# Patient Record
Sex: Female | Born: 1955 | Race: White | Hispanic: No | Smoking: Current every day smoker
Health system: Southern US, Community
[De-identification: ages and names within clinical notes are randomized; demographics above are authoritative.]

## PROBLEM LIST (undated history)

## (undated) DIAGNOSIS — J45909 Unspecified asthma, uncomplicated: Secondary | ICD-10-CM

## (undated) DIAGNOSIS — T7840XA Allergy, unspecified, initial encounter: Secondary | ICD-10-CM

## (undated) DIAGNOSIS — Z9889 Other specified postprocedural states: Secondary | ICD-10-CM

## (undated) DIAGNOSIS — M1990 Unspecified osteoarthritis, unspecified site: Secondary | ICD-10-CM

## (undated) DIAGNOSIS — J432 Centrilobular emphysema: Secondary | ICD-10-CM

## (undated) DIAGNOSIS — K219 Gastro-esophageal reflux disease without esophagitis: Secondary | ICD-10-CM

## (undated) DIAGNOSIS — R112 Nausea with vomiting, unspecified: Secondary | ICD-10-CM

## (undated) HISTORY — DX: Allergy, unspecified, initial encounter: T78.40XA

## (undated) HISTORY — PX: CLEFT PALATE REPAIR: SUR1165

## (undated) HISTORY — PX: BREAST BIOPSY: SHX20

---

## 1982-06-12 HISTORY — PX: ANKLE SURGERY: SHX546

## 2008-05-21 ENCOUNTER — Ambulatory Visit: Payer: Self-pay | Admitting: Gastroenterology

## 2008-06-12 HISTORY — PX: COLONOSCOPY: SHX174

## 2008-06-12 HISTORY — PX: CHOLECYSTECTOMY: SHX55

## 2008-07-07 ENCOUNTER — Ambulatory Visit: Payer: Self-pay

## 2008-10-05 ENCOUNTER — Ambulatory Visit: Payer: Self-pay | Admitting: Internal Medicine

## 2009-03-09 ENCOUNTER — Emergency Department: Payer: Self-pay | Admitting: Emergency Medicine

## 2009-03-10 ENCOUNTER — Ambulatory Visit: Payer: Self-pay | Admitting: Surgery

## 2009-07-08 ENCOUNTER — Ambulatory Visit: Payer: Self-pay

## 2010-07-20 ENCOUNTER — Ambulatory Visit: Payer: Self-pay | Admitting: Family Medicine

## 2010-07-25 ENCOUNTER — Ambulatory Visit: Payer: Self-pay

## 2011-01-25 ENCOUNTER — Ambulatory Visit: Payer: Self-pay | Admitting: Surgery

## 2011-07-26 ENCOUNTER — Ambulatory Visit: Payer: Self-pay | Admitting: Family Medicine

## 2012-02-21 ENCOUNTER — Ambulatory Visit: Payer: Self-pay | Admitting: Family Medicine

## 2012-07-30 ENCOUNTER — Ambulatory Visit: Payer: Self-pay | Admitting: Family Medicine

## 2012-07-31 ENCOUNTER — Ambulatory Visit: Payer: Self-pay | Admitting: Family Medicine

## 2012-12-02 ENCOUNTER — Ambulatory Visit: Payer: Self-pay | Admitting: Surgery

## 2012-12-17 ENCOUNTER — Ambulatory Visit: Payer: Self-pay | Admitting: Surgery

## 2012-12-18 LAB — PATHOLOGY REPORT

## 2013-08-05 ENCOUNTER — Ambulatory Visit: Payer: Self-pay | Admitting: Surgery

## 2013-08-11 ENCOUNTER — Ambulatory Visit: Payer: Self-pay | Admitting: Surgery

## 2013-08-12 LAB — PATHOLOGY REPORT

## 2015-03-22 ENCOUNTER — Encounter: Payer: Self-pay | Admitting: Internal Medicine

## 2015-03-22 ENCOUNTER — Other Ambulatory Visit: Payer: Self-pay | Admitting: Internal Medicine

## 2015-03-22 DIAGNOSIS — Z8711 Personal history of peptic ulcer disease: Secondary | ICD-10-CM

## 2015-03-22 DIAGNOSIS — Z8619 Personal history of other infectious and parasitic diseases: Secondary | ICD-10-CM

## 2015-03-22 DIAGNOSIS — J302 Other seasonal allergic rhinitis: Secondary | ICD-10-CM | POA: Insufficient documentation

## 2015-03-22 DIAGNOSIS — Z87898 Personal history of other specified conditions: Secondary | ICD-10-CM | POA: Insufficient documentation

## 2015-03-22 DIAGNOSIS — J3089 Other allergic rhinitis: Secondary | ICD-10-CM | POA: Insufficient documentation

## 2015-03-22 DIAGNOSIS — R7309 Other abnormal glucose: Secondary | ICD-10-CM

## 2015-03-22 DIAGNOSIS — N6019 Diffuse cystic mastopathy of unspecified breast: Secondary | ICD-10-CM

## 2015-03-22 DIAGNOSIS — R7303 Prediabetes: Secondary | ICD-10-CM | POA: Insufficient documentation

## 2015-03-22 DIAGNOSIS — Z720 Tobacco use: Secondary | ICD-10-CM

## 2015-03-22 HISTORY — DX: Personal history of other infectious and parasitic diseases: Z86.19

## 2015-03-22 HISTORY — DX: Personal history of other specified conditions: Z87.898

## 2015-04-08 ENCOUNTER — Ambulatory Visit (INDEPENDENT_AMBULATORY_CARE_PROVIDER_SITE_OTHER): Payer: PRIVATE HEALTH INSURANCE | Admitting: Internal Medicine

## 2015-04-08 ENCOUNTER — Encounter: Payer: Self-pay | Admitting: Internal Medicine

## 2015-04-08 VITALS — BP 112/72 | HR 68 | Ht 65.0 in | Wt 172.6 lb

## 2015-04-08 DIAGNOSIS — J302 Other seasonal allergic rhinitis: Secondary | ICD-10-CM

## 2015-04-08 DIAGNOSIS — Z0000 Encounter for general adult medical examination without abnormal findings: Secondary | ICD-10-CM | POA: Diagnosis not present

## 2015-04-08 DIAGNOSIS — G5601 Carpal tunnel syndrome, right upper limb: Secondary | ICD-10-CM | POA: Diagnosis not present

## 2015-04-08 DIAGNOSIS — N6019 Diffuse cystic mastopathy of unspecified breast: Secondary | ICD-10-CM | POA: Diagnosis not present

## 2015-04-08 LAB — POCT URINALYSIS DIPSTICK
Bilirubin, UA: NEGATIVE
Blood, UA: NEGATIVE
Glucose, UA: NEGATIVE
Ketones, UA: NEGATIVE
Leukocytes, UA: NEGATIVE
Nitrite, UA: NEGATIVE
PROTEIN UA: NEGATIVE
UROBILINOGEN UA: 0.2
pH, UA: 5

## 2015-04-08 NOTE — Progress Notes (Signed)
Date:  04/08/2015   Name:  Diamond Jackson   DOB:  June 10, 1956   MRN:  [id number]   Chief Complaint: Annual Exam Diamond Jackson is a 59 y.o. female who presents today for her Complete Annual Exam. She feels well. She reports exercising regularly to lower A1C. She reports she is sleeping well. She denies any breast problems.  Last pap smear 2 years ago - normal. She is due for her screening mammogram. She brings blood work done through work which is a complete panel and is normal. A1c is 5.6. Lipid profile is excellent. She has noted occasional tingling in her right hand as well as drawing at the wrist if she holds her wrist in a certain position for a period of time. This happens most commonly while driving the car. Episodes approximately 2 times per month.  Review of Systems  Constitutional: Negative for fever, chills and fatigue.  HENT: Negative for hearing loss, tinnitus and trouble swallowing.   Eyes: Negative for visual disturbance.  Respiratory: Negative for cough, shortness of breath and wheezing.   Cardiovascular: Negative for chest pain, palpitations and leg swelling.  Gastrointestinal: Negative for nausea, abdominal pain and constipation.  Endocrine: Negative for polydipsia and polyuria.  Genitourinary: Negative for dysuria, urgency, frequency, vaginal bleeding and vaginal discharge.  Musculoskeletal: Positive for arthralgias (hips and knees). Negative for joint swelling and gait problem.  Skin: Negative for color change and rash.  Neurological: Negative for light-headedness, numbness and headaches.  Psychiatric/Behavioral: Negative for sleep disturbance and dysphoric mood.    Patient Active Problem List   Diagnosis Date Noted  . Allergic rhinitis, seasonal 03/22/2015  . History of measles, mumps, or rubella 03/22/2015  . Elevated hemoglobin A1c 03/22/2015  . Fibrocystic breast changes 03/22/2015  . Hx of peptic ulcer 03/22/2015  . Current tobacco use 03/22/2015    Prior to  Admission medications   Medication Sig Start Date End Date Taking? Authorizing Provider  albuterol (PROAIR HFA) 108 (90 BASE) MCG/ACT inhaler Inhale into the lungs. 09/04/13   Historical Provider, MD    No Known Allergies  Past Surgical History  Procedure Laterality Date  . Breast biopsy      benign  . Cholecystectomy  2010  . Ankle surgery Right 1984  . Cleft palate repair      as an infant    Social History  Substance Use Topics  . Smoking status: Current Every Day Smoker  . Smokeless tobacco: None  . Alcohol Use: No     Medication list has been reviewed and updated.   Physical Exam  Constitutional: She is oriented to person, place, and time. She appears well-developed and well-nourished. No distress.  HENT:  Head: Normocephalic and atraumatic.  Right Ear: Tympanic membrane and ear canal normal.  Left Ear: Tympanic membrane and ear canal normal.  Nose: Right sinus exhibits no maxillary sinus tenderness. Left sinus exhibits no maxillary sinus tenderness.  Mouth/Throat: Uvula is midline and oropharynx is clear and moist.  Eyes: Conjunctivae and EOM are normal. Right eye exhibits no discharge. Left eye exhibits no discharge. No scleral icterus.  Neck: Normal range of motion. Carotid bruit is not present. No erythema present. No thyromegaly present.  Cardiovascular: Normal rate, regular rhythm, normal heart sounds and normal pulses.   Pulmonary/Chest: Effort normal. No respiratory distress. She has no wheezes. Right breast exhibits inverted nipple. Right breast exhibits no mass, no nipple discharge, no skin change and no tenderness. Left breast exhibits no mass, no nipple discharge,  no skin change and no tenderness.  Abdominal: Soft. Bowel sounds are normal. There is no hepatosplenomegaly. There is no tenderness. There is no CVA tenderness.  Genitourinary: Rectum normal, vagina normal and uterus normal. There is no rash or tenderness on the right labia. There is no rash or  tenderness on the left labia. Cervix exhibits no motion tenderness, no discharge and no friability. Right adnexum displays no mass, no tenderness and no fullness. Left adnexum displays no mass, no tenderness and no fullness.  Musculoskeletal: Normal range of motion.       Arms: Lymphadenopathy:    She has no cervical adenopathy.    She has no axillary adenopathy.  Neurological: She is alert and oriented to person, place, and time. She has normal reflexes. No cranial nerve deficit or sensory deficit.  Skin: Skin is warm, dry and intact. No rash noted.  Psychiatric: She has a normal mood and affect. Her speech is normal and behavior is normal. Thought content normal.  Nursing note and vitals reviewed.   BP 112/72 mmHg  Pulse 68  Ht 5\' 5"  (1.651 m)  Wt 178 lb (80.74 kg)  BMI 29.62 kg/m2  Assessment and Plan: 1. Annual physical exam Pap smear is performed and can be repeated every 3-5 years Colonoscopy was done in 2010 - POCT urinalysis dipstick  2. Allergic rhinitis, seasonal Zyrtec as needed Albuterol inhaler as needed  3. Fibrocystic breast changes, unspecified laterality Normal breast exam except for right inverted nipple Patient will continue self exams - MM DIGITAL SCREENING BILATERAL; Future  4. Carpal tunnel syndrome on right Avoid positions that cause symptoms If symptoms progress would refer to orthopedic hand specialist   Halina Maidens, MD [facility]  04/08/2015

## 2015-04-08 NOTE — Addendum Note (Signed)
Addended by: Glean Hess on: 04/08/2015 11:11 AM   Modules accepted: Orders, SmartSet

## 2015-04-08 NOTE — Patient Instructions (Signed)
Breast Self-Awareness Practicing breast self-awareness may pick up problems early, prevent significant medical complications, and possibly save your life. By practicing breast self-awareness, you can become familiar with how your breasts look and feel and if your breasts are changing. This allows you to notice changes early. It can also offer you some reassurance that your breast health is good. One way to learn what is normal for your breasts and whether your breasts are changing is to do a breast self-exam. If you find a lump or something that was not present in the past, it is best to contact your caregiver right away. Other findings that should be evaluated by your caregiver include nipple discharge, especially if it is bloody; skin changes or reddening; areas where the skin seems to be pulled in (retracted); or new lumps and bumps. Breast pain is seldom associated with cancer (malignancy), but should also be evaluated by a caregiver. HOW TO PERFORM A BREAST SELF-EXAM The best time to examine your breasts is 5-7 days after your menstrual period is over. During menstruation, the breasts are lumpier, and it may be more difficult to pick up changes. If you do not menstruate, have reached menopause, or had your uterus removed (hysterectomy), you should examine your breasts at regular intervals, such as monthly. If you are breastfeeding, examine your breasts after a feeding or after using a breast pump. Breast implants do not decrease the risk for lumps or tumors, so continue to perform breast self-exams as recommended. Talk to your caregiver about how to determine the difference between the implant and breast tissue. Also, talk about the amount of pressure you should use during the exam. Over time, you will become more familiar with the variations of your breasts and more comfortable with the exam. A breast self-exam requires you to remove all your clothes above the waist. 1. Look at your breasts and nipples.  Stand in front of a mirror in a room with good lighting. With your hands on your hips, push your hands firmly downward. Look for a difference in shape, contour, and size from one breast to the other (asymmetry). Asymmetry includes puckers, dips, or bumps. Also, look for skin changes, such as reddened or scaly areas on the breasts. Look for nipple changes, such as discharge, dimpling, repositioning, or redness. 2. Carefully feel your breasts. This is best done either in the shower or tub while using soapy water or when flat on your back. Place the arm (on the side of the breast you are examining) above your head. Use the pads (not the fingertips) of your three middle fingers on your opposite hand to feel your breasts. Start in the underarm area and use  inch (2 cm) overlapping circles to feel your breast. Use 3 different levels of pressure (light, medium, and firm pressure) at each circle before moving to the next circle. The light pressure is needed to feel the tissue closest to the skin. The medium pressure will help to feel breast tissue a little deeper, while the firm pressure is needed to feel the tissue close to the ribs. Continue the overlapping circles, moving downward over the breast until you feel your ribs below your breast. Then, move one finger-width towards the center of the body. Continue to use the  inch (2 cm) overlapping circles to feel your breast as you move slowly up toward the collar bone (clavicle) near the base of the neck. Continue the up and down exam using all 3 pressures until you reach the   middle of the chest. Do this with each breast, carefully feeling for lumps or changes. 3.  Keep a written record with breast changes or normal findings for each breast. By writing this information down, you do not need to depend only on memory for size, tenderness, or location. Write down where you are in your menstrual cycle, if you are still menstruating. Breast tissue can have some lumps or  thick tissue. However, see your caregiver if you find anything that concerns you.  SEEK MEDICAL CARE IF:  You see a change in shape, contour, or size of your breasts or nipples.   You see skin changes, such as reddened or scaly areas on the breasts or nipples.   You have an unusual discharge from your nipples.   You feel a new lump or unusually thick areas.    This information is not intended to replace advice given to you by your health care provider. Make sure you discuss any questions you have with your health care provider.   Document Released: 05/29/2005 Document Revised: 05/15/2012 Document Reviewed: 09/13/2011 Elsevier Interactive Patient Education 2016 [employer]  

## 2015-04-13 LAB — PAP IG AND HPV HIGH-RISK
HPV, high-risk: NEGATIVE
PAP Smear Comment: 0

## 2015-04-30 ENCOUNTER — Ambulatory Visit
Admission: RE | Admit: 2015-04-30 | Discharge: 2015-04-30 | Disposition: A | Discharge status: Home / Self Care | Payer: PRIVATE HEALTH INSURANCE | Source: Ambulatory Visit | Attending: Internal Medicine | Admitting: Internal Medicine

## 2015-04-30 DIAGNOSIS — Z1231 Encounter for screening mammogram for malignant neoplasm of breast: Secondary | ICD-10-CM | POA: Diagnosis not present

## 2015-04-30 DIAGNOSIS — N6019 Diffuse cystic mastopathy of unspecified breast: Secondary | ICD-10-CM

## 2015-06-08 ENCOUNTER — Ambulatory Visit (INDEPENDENT_AMBULATORY_CARE_PROVIDER_SITE_OTHER): Payer: PRIVATE HEALTH INSURANCE | Admitting: Internal Medicine

## 2015-06-08 ENCOUNTER — Encounter: Payer: Self-pay | Admitting: Internal Medicine

## 2015-06-08 VITALS — BP 118/70 | HR 88 | Ht 65.0 in | Wt 176.8 lb

## 2015-06-08 DIAGNOSIS — R300 Dysuria: Secondary | ICD-10-CM | POA: Diagnosis not present

## 2015-06-08 LAB — POCT URINALYSIS DIPSTICK
Bilirubin, UA: NEGATIVE
GLUCOSE UA: NEGATIVE
NITRITE UA: NEGATIVE
PH UA: 5
PROTEIN UA: NEGATIVE
SPEC GRAV UA: 1.02
Urobilinogen, UA: 0.2

## 2015-06-08 MED ORDER — CIPROFLOXACIN HCL 250 MG PO TABS: 250.0000 mg | ORAL_TABLET | Freq: Two times a day (BID) | ORAL | Status: DC

## 2015-06-08 NOTE — Progress Notes (Signed)
    Date:  06/08/2015   Name:  Diamond Jackson   DOB:  July 08, 1955   MRN:  [id number]   Chief Complaint: Urinary Tract Infection Urinary Tract Infection  This is a new problem. The current episode started in the past 7 days. The problem occurs every urination. The problem has been unchanged. The quality of the pain is described as burning. The pain is mild. Associated symptoms include frequency and urgency. Pertinent negatives include no chills, hematuria or vomiting. She has tried increased fluids for the symptoms. The treatment provided mild relief. Her past medical history is significant for recurrent UTIs.    Review of Systems  Constitutional: Negative for fever, chills and fatigue.  Respiratory: Negative for chest tightness and shortness of breath.   Cardiovascular: Negative for chest pain.  Gastrointestinal: Negative for vomiting, abdominal pain and diarrhea.  Genitourinary: Positive for dysuria, urgency and frequency. Negative for hematuria.  Musculoskeletal: Negative for back pain.    Patient Active Problem List   Diagnosis Date Noted  . Allergic rhinitis, seasonal 03/22/2015  . History of measles, mumps, or rubella 03/22/2015  . Elevated hemoglobin A1c 03/22/2015  . Fibrocystic breast changes 03/22/2015  . Hx of peptic ulcer 03/22/2015  . Current tobacco use 03/22/2015    Prior to Admission medications   Medication Sig Start Date End Date Taking? Authorizing Provider  albuterol (PROAIR HFA) 108 (90 BASE) MCG/ACT inhaler Inhale into the lungs. 09/04/13  Yes Historical Provider, MD  cetirizine (ZYRTEC) 10 MG tablet Take 10 mg by mouth daily as needed for allergies.   Yes Historical Provider, MD    No Known Allergies  Past Surgical History  Procedure Laterality Date  . Cholecystectomy  2010  . Ankle surgery Right 1984  . Cleft palate repair      as an infant  . Colonoscopy  2010    normal  . Breast biopsy Left     benign two areas    Social History  Substance Use  Topics  . Smoking status: Current Every Day Smoker  . Smokeless tobacco: None  . Alcohol Use: No     Medication list has been reviewed and updated.   Physical Exam  Constitutional: She appears well-developed and well-nourished.  Cardiovascular: Normal rate, regular rhythm and normal heart sounds.   Pulmonary/Chest: Effort normal and breath sounds normal. No respiratory distress.  Abdominal: Soft. Bowel sounds are normal. There is tenderness in the suprapubic area. There is no rebound, no guarding and no CVA tenderness.  Psychiatric: She has a normal mood and affect.  Nursing note and vitals reviewed.  Urine dipstick shows positive for WBC's and positive for RBC's.  Micro exam: not done.   BP 118/70 mmHg  Pulse 88  Ht 5\' 5"  (1.651 m)  Wt 176 lb 12.8 oz (80.196 kg)  BMI 29.42 kg/m2  Assessment and Plan: 1. Dysuria Continue increased fluids Follow up if no improvement - POCT urinalysis dipstick - ciprofloxacin (CIPRO) 250 MG tablet; Take 1 tablet (250 mg total) by mouth 2 (two) times daily.  Dispense: 10 tablet; Refill: 0   Halina Maidens, MD [facility]  06/08/2015

## 2016-02-10 ENCOUNTER — Other Ambulatory Visit: Payer: Self-pay | Admitting: Family Medicine

## 2016-02-10 MED ORDER — ALBUTEROL SULFATE HFA 108 (90 BASE) MCG/ACT IN AERS: 1.0000 | INHALATION_SPRAY | Freq: Four times a day (QID) | RESPIRATORY_TRACT | 2 refills | Status: DC | PRN

## 2016-02-10 NOTE — Progress Notes (Signed)
BP 118/74, pulse 80, resp. 20, ht.65" and wt. 194.5 lbs. Been on Chantix since 12/02/15 and down to "a couple" cigarettes a day from a 2 ppd habit. No nausea now but some heartburn (gives a history of peptic ulcer in the past). Recommend she follow reflux precautions, stop ALL tobacco use and use Pepcid-AC BID. Notice some wheezing with heartburn/reflux. Given reflux precautions and refilled her Albuterol-HFA inhaler. Will finish 12 weeks of Chantix use in a month.

## 2016-03-21 LAB — HEPATIC FUNCTION PANEL
ALK PHOS: 67 U/L (ref 25–125)
ALT: 22 U/L (ref 7–35)
AST: 17 U/L (ref 13–35)
BILIRUBIN, TOTAL: 0.3 mg/dL

## 2016-03-21 LAB — TSH: TSH: 7.7 u[IU]/mL — AB (ref 0.41–5.90)

## 2016-03-21 LAB — BASIC METABOLIC PANEL
BUN: 14 mg/dL (ref 4–21)
Creatinine: 1 mg/dL (ref 0.5–1.1)
GLUCOSE: 113 mg/dL
Potassium: 4.2 mmol/L (ref 3.4–5.3)
Sodium: 142 mmol/L (ref 137–147)

## 2016-03-21 LAB — HEMOGLOBIN A1C: HEMOGLOBIN A1C: 6.2

## 2016-03-21 LAB — CBC AND DIFFERENTIAL
HEMATOCRIT: 44 % (ref 36–46)
Hemoglobin: 14.3 g/dL (ref 12.0–16.0)
PLATELETS: 226 10*3/uL (ref 150–399)
WBC: 8.6 10*3/mL

## 2016-03-21 LAB — LIPID PANEL
Cholesterol: 170 mg/dL (ref 0–200)
HDL: 43 mg/dL (ref 35–70)
LDL CALC: 97 mg/dL
Triglycerides: 148 mg/dL (ref 40–160)

## 2016-04-10 ENCOUNTER — Encounter: Payer: Self-pay | Admitting: Internal Medicine

## 2016-04-10 ENCOUNTER — Ambulatory Visit (INDEPENDENT_AMBULATORY_CARE_PROVIDER_SITE_OTHER): Payer: PRIVATE HEALTH INSURANCE | Admitting: Internal Medicine

## 2016-04-10 VITALS — BP 118/78 | HR 82 | Resp 16 | Ht 65.0 in | Wt 198.0 lb

## 2016-04-10 DIAGNOSIS — R7309 Other abnormal glucose: Secondary | ICD-10-CM

## 2016-04-10 DIAGNOSIS — Z1231 Encounter for screening mammogram for malignant neoplasm of breast: Secondary | ICD-10-CM | POA: Diagnosis not present

## 2016-04-10 DIAGNOSIS — J40 Bronchitis, not specified as acute or chronic: Secondary | ICD-10-CM | POA: Diagnosis not present

## 2016-04-10 DIAGNOSIS — Z0000 Encounter for general adult medical examination without abnormal findings: Secondary | ICD-10-CM | POA: Diagnosis not present

## 2016-04-10 DIAGNOSIS — Z1239 Encounter for other screening for malignant neoplasm of breast: Secondary | ICD-10-CM

## 2016-04-10 DIAGNOSIS — J302 Other seasonal allergic rhinitis: Secondary | ICD-10-CM

## 2016-04-10 DIAGNOSIS — F17200 Nicotine dependence, unspecified, uncomplicated: Secondary | ICD-10-CM | POA: Diagnosis not present

## 2016-04-10 LAB — POCT URINALYSIS DIPSTICK
BILIRUBIN UA: NEGATIVE
Blood, UA: NEGATIVE
GLUCOSE UA: NEGATIVE
Ketones, UA: NEGATIVE
LEUKOCYTES UA: NEGATIVE
NITRITE UA: NEGATIVE
Protein, UA: NEGATIVE
Spec Grav, UA: <=1.005
Urobilinogen, UA: 0.2
pH, UA: 7

## 2016-04-10 MED ORDER — AMOXICILLIN-POT CLAVULANATE 875-125 MG PO TABS: 1.0000 | ORAL_TABLET | Freq: Two times a day (BID) | ORAL | 0 refills | Status: DC

## 2016-04-10 NOTE — Progress Notes (Signed)
Date:  04/10/2016   Name:  Diamond Jackson   DOB:  Nov 08, 1955   MRN:  [id number]   Chief Complaint: Annual Exam Diamond Jackson is a 60 y.o. female who presents today for her Complete Annual Exam. She feels well. She reports exercising none. She reports she is sleeping fairly well.   Recent labs from work reviewed.  Normal except for A1C 6.2.  Lab Results  Component Value Date   CHOL 170 03/21/2016   HDL 43 03/21/2016   LDLCALC 97 03/21/2016   TRIG 148 03/21/2016   Lab Results  Component Value Date   HGBA1C 6.2 03/21/2016   Lab Results  Component Value Date   CREATININE 1.0 03/21/2016    Sinus Problem  This is a new problem. The current episode started 1 to 4 weeks ago. The problem is unchanged. There has been no fever. Associated symptoms include congestion, coughing and sinus pressure. Pertinent negatives include no chills, headaches or shortness of breath. Past treatments include oral decongestants. The treatment provided mild relief.    Review of Systems  Constitutional: Positive for unexpected weight change (20 lbs since husband's illness). Negative for chills, fatigue and fever.  HENT: Positive for congestion, postnasal drip and sinus pressure. Negative for hearing loss, tinnitus, trouble swallowing and voice change.   Eyes: Negative for visual disturbance.  Respiratory: Positive for cough, chest tightness and wheezing. Negative for shortness of breath.   Cardiovascular: Negative for chest pain, palpitations and leg swelling.  Gastrointestinal: Negative for abdominal pain, constipation, diarrhea and vomiting.  Endocrine: Negative for polydipsia and polyuria.  Genitourinary: Negative for dysuria, frequency, genital sores, vaginal bleeding and vaginal discharge.  Musculoskeletal: Negative for arthralgias, gait problem and joint swelling.  Skin: Negative for color change and rash.  Neurological: Negative for dizziness, tremors, light-headedness and headaches.    Hematological: Negative for adenopathy. Does not bruise/bleed easily.  Psychiatric/Behavioral: Negative for dysphoric mood and sleep disturbance. The patient is not nervous/anxious.     Patient Active Problem List   Diagnosis Date Noted  . Allergic rhinitis, seasonal 03/22/2015  . History of measles, mumps, or rubella 03/22/2015  . Elevated hemoglobin A1c 03/22/2015  . Fibrocystic breast changes 03/22/2015  . Hx of peptic ulcer 03/22/2015  . Current tobacco use 03/22/2015    Prior to Admission medications   Medication Sig Start Date End Date Taking? Authorizing Provider  albuterol (PROAIR HFA) 108 (90 Base) MCG/ACT inhaler Inhale 1-2 puffs into the lungs every 6 (six) hours as needed for wheezing or shortness of breath. 02/10/16  Yes Dennis E Chrismon, PA  cetirizine (ZYRTEC) 10 MG tablet Take 10 mg by mouth daily as needed for allergies.   Yes Historical Provider, MD    No Known Allergies  Past Surgical History:  Procedure Laterality Date  . ANKLE SURGERY Right 1984  . BREAST BIOPSY Left    benign two areas  . CHOLECYSTECTOMY  2010  . CLEFT PALATE REPAIR     as an infant  . COLONOSCOPY  2010   normal    Social History  Substance Use Topics  . Smoking status: Current Every Day Smoker    Packs/day: 0.25    Types: Cigarettes  . Smokeless tobacco: Never Used  . Alcohol use No     Medication list has been reviewed and updated.   Physical Exam  Constitutional: She is oriented to person, place, and time. She appears well-developed and well-nourished. No distress.  HENT:  Head: Normocephalic and atraumatic.  Right Ear: Tympanic membrane and ear canal normal.  Left Ear: Tympanic membrane and ear canal normal.  Nose: Right sinus exhibits maxillary sinus tenderness and frontal sinus tenderness. Left sinus exhibits maxillary sinus tenderness and frontal sinus tenderness.  Mouth/Throat: Uvula is midline. Posterior oropharyngeal erythema (and mucoid drainage) present. No  oropharyngeal exudate.  Eyes: Conjunctivae and EOM are normal. Right eye exhibits no discharge. Left eye exhibits no discharge. No scleral icterus.  Neck: Normal range of motion. Carotid bruit is not present. No erythema present. No thyromegaly present.  Cardiovascular: Normal rate, regular rhythm, normal heart sounds and normal pulses.   Pulmonary/Chest: Effort normal. No respiratory distress. She has no wheezes. Right breast exhibits inverted nipple (unchanged). Right breast exhibits no mass, no nipple discharge, no skin change and no tenderness. Left breast exhibits no mass, no nipple discharge, no skin change and no tenderness.  Abdominal: Soft. Bowel sounds are normal. There is no hepatosplenomegaly. There is no tenderness. There is no CVA tenderness.  Musculoskeletal: Normal range of motion.  Lymphadenopathy:    She has no cervical adenopathy.    She has no axillary adenopathy.  Neurological: She is alert and oriented to person, place, and time. She has normal reflexes. No cranial nerve deficit or sensory deficit.  Skin: Skin is warm, dry and intact. No rash noted.  Psychiatric: She has a normal mood and affect. Her speech is normal and behavior is normal. Thought content normal.  Nursing note and vitals reviewed.   BP 118/78   Pulse 82   Resp 16   Ht 5\' 5"  (1.651 m)   Wt 198 lb (89.8 kg)   SpO2 97%   BMI 32.95 kg/m   Assessment and Plan: 1. Annual physical exam Work on diet and exercise for weight loss - POCT urinalysis dipstick  2. Breast cancer screening - MM DIGITAL SCREENING BILATERAL; Future  3. Bronchitis Continue antihistamines and flonase Albuterol MDI - amoxicillin-clavulanate (AUGMENTIN) 875-125 MG tablet; Take 1 tablet by mouth 2 (two) times daily.  Dispense: 20 tablet; Refill: 0  4. Acute seasonal allergic rhinitis, unspecified trigger Stable on zyrtec  5. Elevated hemoglobin A1c Will improve with weight loss  6. Smoker - Nurse to provide smoking /  tobacco cessation education   Halina Maidens, MD [facility]  04/10/2016

## 2016-05-01 ENCOUNTER — Ambulatory Visit
Admission: RE | Admit: 2016-05-01 | Discharge: 2016-05-01 | Disposition: A | Discharge status: Home / Self Care | Payer: PRIVATE HEALTH INSURANCE | Source: Ambulatory Visit | Attending: Internal Medicine | Admitting: Internal Medicine

## 2016-05-01 DIAGNOSIS — Z1231 Encounter for screening mammogram for malignant neoplasm of breast: Secondary | ICD-10-CM | POA: Diagnosis present

## 2016-05-01 DIAGNOSIS — Z1239 Encounter for other screening for malignant neoplasm of breast: Secondary | ICD-10-CM

## 2016-11-27 ENCOUNTER — Encounter: Payer: Self-pay | Admitting: Internal Medicine

## 2016-11-27 ENCOUNTER — Ambulatory Visit (INDEPENDENT_AMBULATORY_CARE_PROVIDER_SITE_OTHER): Payer: PRIVATE HEALTH INSURANCE | Admitting: Internal Medicine

## 2016-11-27 VITALS — BP 116/64 | HR 90 | Temp 97.7°F | Ht 65.0 in | Wt 184.0 lb

## 2016-11-27 DIAGNOSIS — N3001 Acute cystitis with hematuria: Secondary | ICD-10-CM | POA: Diagnosis not present

## 2016-11-27 LAB — POC URINALYSIS WITH MICROSCOPIC (NON AUTO)MANUAL RESULT
BILIRUBIN UA: NEGATIVE
Crystals: 0
EPITHELIAL CELLS, URINE PER MICROSCOPY: 1
Glucose, UA: NEGATIVE
Ketones, UA: NEGATIVE
Leukocytes, UA: NEGATIVE
MUCUS UA: 0
NITRITE UA: NEGATIVE
PH UA: 5 (ref 5.0–8.0)
PROTEIN UA: NEGATIVE
RBC: 2 M/uL — AB (ref 4.04–5.48)
Spec Grav, UA: 1.015 (ref 1.010–1.025)
UROBILINOGEN UA: 0.2 U/dL
WBC CASTS UA: 0

## 2016-11-27 MED ORDER — CIPROFLOXACIN HCL 250 MG PO TABS: 250.0000 mg | ORAL_TABLET | Freq: Two times a day (BID) | ORAL | 0 refills | Status: AC

## 2016-11-27 NOTE — Patient Instructions (Signed)

## 2016-11-27 NOTE — Progress Notes (Signed)
Date:  11/27/2016   Name:  Diamond Jackson   DOB:  02/08/1956   MRN:  [id number]   Chief Complaint: Urinary Tract Infection (Pain in lower back and in left pelvis. Feeling like running a fever. Hurts in pelvic area when voiding. Going more frequently.  ) Urinary Tract Infection   This is a new problem. The current episode started in the past 7 days. The problem occurs every urination. The problem has been unchanged. The quality of the pain is described as burning. The pain is mild. There has been no fever. There is no history of pyelonephritis. Associated symptoms include flank pain, frequency and urgency. Pertinent negatives include no chills or vomiting. She has tried nothing for the symptoms.      Review of Systems  Constitutional: Negative for chills and fever.  Respiratory: Negative for chest tightness and shortness of breath.   Cardiovascular: Negative for chest pain.  Gastrointestinal: Positive for abdominal pain. Negative for diarrhea and vomiting.  Genitourinary: Positive for dysuria, flank pain, frequency and urgency.    Patient Active Problem List   Diagnosis Date Noted  . Allergic rhinitis, seasonal 03/22/2015  . History of measles, mumps, or rubella 03/22/2015  . Elevated hemoglobin A1c 03/22/2015  . Fibrocystic breast changes 03/22/2015  . Hx of peptic ulcer 03/22/2015  . Current tobacco use 03/22/2015    Prior to Admission medications   Medication Sig Start Date End Date Taking? Authorizing Provider  albuterol (PROAIR HFA) 108 (90 Base) MCG/ACT inhaler Inhale 1-2 puffs into the lungs every 6 (six) hours as needed for wheezing or shortness of breath. 02/10/16  Yes Chrismon, Vickki Muff, PA  cetirizine (ZYRTEC) 10 MG tablet Take 10 mg by mouth daily as needed for allergies.   Yes [provider]    No Known Allergies  Past Surgical History:  Procedure Laterality Date  . ANKLE SURGERY Right 1984  . BREAST BIOPSY Left    benign two areas/clips  .  CHOLECYSTECTOMY  2010  . CLEFT PALATE REPAIR     as an infant  . COLONOSCOPY  2010   normal    Social History  Substance Use Topics  . Smoking status: Current Every Day Smoker    Packs/day: 0.25    Types: Cigarettes  . Smokeless tobacco: Never Used  . Alcohol use No     Medication list has been reviewed and updated.   Physical Exam  Constitutional: She is oriented to person, place, and time. She appears well-developed and well-nourished. No distress.  HENT:  Head: Normocephalic and atraumatic.  Cardiovascular: Normal rate, regular rhythm and normal heart sounds.   Pulmonary/Chest: Effort normal and breath sounds normal. No respiratory distress.  Abdominal: Soft. Bowel sounds are normal. There is tenderness in the suprapubic area and left lower quadrant. There is no rebound, no guarding and no CVA tenderness.  Musculoskeletal: Normal range of motion.  Neurological: She is alert and oriented to person, place, and time.  Skin: Skin is warm and dry. No rash noted.  Psychiatric: She has a normal mood and affect. Her behavior is normal. Thought content normal.  Nursing note and vitals reviewed.  Urine dipstick shows positive for RBC's.  Micro exam: 2+ bacteria.  BP 116/64   Pulse 90   Temp 97.7 F (36.5 C)   Ht 5\' 5"  (1.651 m)   Wt 184 lb (83.5 kg)   SpO2 97%   BMI 30.62 kg/m   Assessment and Plan: 1. Acute cystitis with hematuria  Continue to push fluids Follow up if no improvement in 2 days - POC urinalysis w microscopic (non auto) - ciprofloxacin (CIPRO) 250 MG tablet; Take 1 tablet (250 mg total) by mouth 2 (two) times daily.  Dispense: 14 tablet; Refill: 0   Meds ordered this encounter  Medications  . ciprofloxacin (CIPRO) 250 MG tablet    Sig: Take 1 tablet (250 mg total) by mouth 2 (two) times daily.    Dispense:  14 tablet    Refill:  0    Halina Maidens, MD [facility]  11/27/2016

## 2017-04-10 LAB — TSH: TSH: 9.2 — AB (ref 0.41–5.90)

## 2017-04-10 LAB — CBC AND DIFFERENTIAL
HEMATOCRIT: 45 (ref 36–46)
Hemoglobin: 14.6 (ref 12.0–16.0)
Platelets: 211 (ref 150–399)
WBC: 8.3

## 2017-04-10 LAB — LIPID PANEL
Cholesterol: 165 (ref 0–200)
HDL: 37 (ref 35–70)
LDL CALC: 97
TRIGLYCERIDES: 154 (ref 40–160)

## 2017-04-10 LAB — BASIC METABOLIC PANEL
BUN: 12 (ref 4–21)
Creatinine: 0.9 (ref 0.5–1.1)
Glucose: 108
Potassium: 4.2 (ref 3.4–5.3)
SODIUM: 143 (ref 137–147)

## 2017-04-11 ENCOUNTER — Encounter: Payer: Self-pay | Admitting: Internal Medicine

## 2017-04-11 ENCOUNTER — Ambulatory Visit (INDEPENDENT_AMBULATORY_CARE_PROVIDER_SITE_OTHER): Payer: PRIVATE HEALTH INSURANCE | Admitting: Internal Medicine

## 2017-04-11 VITALS — BP 118/72 | HR 68 | Ht 65.0 in | Wt 181.0 lb

## 2017-04-11 DIAGNOSIS — R7309 Other abnormal glucose: Secondary | ICD-10-CM | POA: Diagnosis not present

## 2017-04-11 DIAGNOSIS — Z1239 Encounter for other screening for malignant neoplasm of breast: Secondary | ICD-10-CM

## 2017-04-11 DIAGNOSIS — Z720 Tobacco use: Secondary | ICD-10-CM

## 2017-04-11 DIAGNOSIS — M7062 Trochanteric bursitis, left hip: Secondary | ICD-10-CM

## 2017-04-11 DIAGNOSIS — Z1231 Encounter for screening mammogram for malignant neoplasm of breast: Secondary | ICD-10-CM | POA: Diagnosis not present

## 2017-04-11 DIAGNOSIS — Z0000 Encounter for general adult medical examination without abnormal findings: Secondary | ICD-10-CM | POA: Diagnosis not present

## 2017-04-11 DIAGNOSIS — Z23 Encounter for immunization: Secondary | ICD-10-CM | POA: Diagnosis not present

## 2017-04-11 DIAGNOSIS — Z8711 Personal history of peptic ulcer disease: Secondary | ICD-10-CM

## 2017-04-11 LAB — POCT URINALYSIS DIPSTICK
BILIRUBIN UA: NEGATIVE
GLUCOSE UA: NEGATIVE
KETONES UA: NEGATIVE
Leukocytes, UA: NEGATIVE
Nitrite, UA: NEGATIVE
Protein, UA: NEGATIVE
SPEC GRAV UA: 1.015 (ref 1.010–1.025)
Urobilinogen, UA: 0.2 E.U./dL
pH, UA: 5 (ref 5.0–8.0)

## 2017-04-11 MED ORDER — PREDNISONE 10 MG PO TABS: ORAL_TABLET | ORAL | 0 refills | Status: DC

## 2017-04-11 MED ORDER — ZOSTER VAC RECOMB ADJUVANTED 50 MCG/0.5ML IM SUSR: 0.5000 mL | Freq: Once | INTRAMUSCULAR | 1 refills | Status: AC

## 2017-04-11 NOTE — Progress Notes (Signed)
Date:  04/11/2017   Name:  Diamond Jackson   DOB:  Mar 02, 1956   MRN:  [id number]   Chief Complaint: Annual Exam (Breast Exam. ) Diamond Jackson is a 61 y.o. female who presents today for her Complete Annual Exam. She feels fairly well. She reports exercising walking. She reports she is sleeping fairly well.  Still smoking but has cut back to 1 ppd.  She had complete labs at work yesterday and will bring in a copy She continues on pepcid for heartburn and zyrtec for allergies.  Review of Systems  Constitutional: Negative for chills, fatigue and fever.  HENT: Negative for congestion, hearing loss, tinnitus, trouble swallowing and voice change.   Eyes: Negative for visual disturbance.  Respiratory: Positive for wheezing (intermittently with allergies). Negative for cough, chest tightness and shortness of breath.   Cardiovascular: Negative for chest pain, palpitations and leg swelling.  Gastrointestinal: Negative for abdominal pain, constipation, diarrhea and vomiting.  Endocrine: Negative for polydipsia and polyuria.  Genitourinary: Negative for dysuria, frequency, genital sores, vaginal bleeding and vaginal discharge.  Musculoskeletal: Positive for arthralgias (in left hip). Negative for gait problem and joint swelling.  Skin: Negative for color change and rash.  Neurological: Negative for dizziness, tremors, light-headedness and headaches.  Hematological: Negative for adenopathy. Does not bruise/bleed easily.  Psychiatric/Behavioral: Negative for dysphoric mood and sleep disturbance. The patient is not nervous/anxious.     Patient Active Problem List   Diagnosis Date Noted  . Allergic rhinitis, seasonal 03/22/2015  . History of measles, mumps, or rubella 03/22/2015  . Elevated hemoglobin A1c 03/22/2015  . Fibrocystic breast changes 03/22/2015  . Hx of peptic ulcer 03/22/2015  . Current tobacco use 03/22/2015    Prior to Admission medications   Medication Sig Start Date End Date  Taking? Authorizing Provider  albuterol (PROAIR HFA) 108 (90 Base) MCG/ACT inhaler Inhale 1-2 puffs into the lungs every 6 (six) hours as needed for wheezing or shortness of breath. 02/10/16   Chrismon, Vickki Muff, PA  cetirizine (ZYRTEC) 10 MG tablet Take 10 mg by mouth daily as needed for allergies.    [provider]    No Known Allergies  Past Surgical History:  Procedure Laterality Date  . ANKLE SURGERY Right 1984  . BREAST BIOPSY Left    benign two areas/clips  . CHOLECYSTECTOMY  2010  . CLEFT PALATE REPAIR     as an infant  . COLONOSCOPY  2010   normal    Social History  Substance Use Topics  . Smoking status: Current Every Day Smoker    Packs/day: 0.25    Types: Cigarettes  . Smokeless tobacco: Never Used  . Alcohol use No     Medication list has been reviewed and updated.  PHQ 2/9 Scores 04/11/2017 04/10/2016 06/08/2015 04/08/2015  PHQ - 2 Score 1 0 0 0    Physical Exam  Constitutional: She is oriented to person, place, and time. She appears well-developed and well-nourished. No distress.  HENT:  Head: Normocephalic and atraumatic.  Right Ear: Tympanic membrane and ear canal normal.  Left Ear: Tympanic membrane and ear canal normal.  Nose: Right sinus exhibits no maxillary sinus tenderness. Left sinus exhibits no maxillary sinus tenderness.  Mouth/Throat: Uvula is midline and oropharynx is clear and moist.  Eyes: Conjunctivae and EOM are normal. Right eye exhibits no discharge. Left eye exhibits no discharge. No scleral icterus.  Neck: Normal range of motion. Carotid bruit is not present. No erythema present. No  thyromegaly present.  Cardiovascular: Normal rate, regular rhythm, normal heart sounds and normal pulses.   Pulmonary/Chest: Effort normal. No respiratory distress. She has no wheezes. Right breast exhibits no mass, no nipple discharge, no skin change and no tenderness. Left breast exhibits no mass, no nipple discharge, no skin change and no  tenderness.  Abdominal: Soft. Bowel sounds are normal. There is no hepatosplenomegaly. There is no tenderness. There is no CVA tenderness.  Musculoskeletal: Normal range of motion.       Left hip: She exhibits tenderness.  Lymphadenopathy:    She has no cervical adenopathy.    She has no axillary adenopathy.  Neurological: She is alert and oriented to person, place, and time. She has normal reflexes. No cranial nerve deficit or sensory deficit.  Skin: Skin is warm, dry and intact. No rash noted.  Psychiatric: She has a normal mood and affect. Her speech is normal and behavior is normal. Thought content normal.  Nursing note and vitals reviewed.   BP 118/72   Pulse 68   Ht 5\' 5"  (1.651 m)   Wt 181 lb (82.1 kg)   SpO2 97%   BMI 30.12 kg/m   Assessment and Plan: 1. Annual physical exam Normal exam Continue exercise and healthy diet - POCT urinalysis dipstick  2. Breast cancer screening - [facility] DIGITAL SCREENING BILATERAL; Future  3. Elevated hemoglobin A1c Labs pending  4. Current tobacco use Encouraged her to continue to cut back and stop completely  5. Trochanteric bursitis of left hip May need Ortho - predniSONE (DELTASONE) 10 MG tablet; Take 6 on day 1, 5 on day 2, 4 on day 3, 3 on day 4, 2 on day 5 and 1 on day 1 then stop.  Dispense: 21 tablet; Refill: 0  6. Need for shingles vaccine - Zoster Vaccine Adjuvanted [facility]) injection; Inject 0.5 mLs into the muscle once.  Dispense: 0.5 mL; Refill: 1  7. Hx of peptic ulcer Continue pepcid   Meds ordered this encounter  Medications  . predniSONE (DELTASONE) 10 MG tablet    Sig: Take 6 on day 1, 5 on day 2, 4 on day 3, 3 on day 4, 2 on day 5 and 1 on day 1 then stop.    Dispense:  21 tablet    Refill:  0  . Zoster Vaccine Adjuvanted [facility]) injection    Sig: Inject 0.5 mLs into the muscle once.    Dispense:  0.5 mL    Refill:  1    Partially dictated using Editor, commissioning. Any errors are  unintentional.  Halina Maidens, MD [facility]  04/11/2017

## 2017-04-13 ENCOUNTER — Telehealth: Payer: Self-pay

## 2017-04-13 NOTE — Telephone Encounter (Signed)
Spoke with patient and informed her UA had blood present. Informed her to come in about 3 months and recheck. Just drop off Urine and we will do UA and micro only.

## 2017-04-13 NOTE — Telephone Encounter (Signed)
-----   Message from Glean Hess, MD sent at 04/11/2017 12:13 PM EDT ----- Regarding: urine Please let pt know that there was some blood in her urine.  I would like to recheck that in about 3 mnths - UA and micro only - no need for OV.

## 2017-04-17 ENCOUNTER — Other Ambulatory Visit: Payer: Self-pay

## 2017-04-17 NOTE — Progress Notes (Signed)
Let patient know that labs are normal (in case she had any questions.)  Continue current medications.

## 2017-05-07 ENCOUNTER — Ambulatory Visit
Admission: RE | Admit: 2017-05-07 | Discharge: 2017-05-07 | Disposition: A | Discharge status: Home / Self Care | Payer: PRIVATE HEALTH INSURANCE | Source: Ambulatory Visit | Attending: Internal Medicine | Admitting: Internal Medicine

## 2017-05-07 DIAGNOSIS — Z1231 Encounter for screening mammogram for malignant neoplasm of breast: Secondary | ICD-10-CM | POA: Diagnosis not present

## 2017-05-07 DIAGNOSIS — Z1239 Encounter for other screening for malignant neoplasm of breast: Secondary | ICD-10-CM

## 2017-08-03 ENCOUNTER — Other Ambulatory Visit: Payer: Self-pay | Admitting: Internal Medicine

## 2017-08-03 ENCOUNTER — Other Ambulatory Visit (INDEPENDENT_AMBULATORY_CARE_PROVIDER_SITE_OTHER): Payer: Self-pay

## 2017-08-03 ENCOUNTER — Ambulatory Visit: Payer: Self-pay | Admitting: Internal Medicine

## 2017-08-03 DIAGNOSIS — R3915 Urgency of urination: Secondary | ICD-10-CM

## 2017-08-03 LAB — POCT URINALYSIS DIPSTICK
Bilirubin, UA: NEGATIVE
Glucose, UA: NEGATIVE
Ketones, UA: NEGATIVE
NITRITE UA: NEGATIVE
Protein, UA: NEGATIVE
SPEC GRAV UA: 1.02 (ref 1.010–1.025)
Urobilinogen, UA: 0.2 E.U./dL
pH, UA: 5 (ref 5.0–8.0)

## 2017-08-03 MED ORDER — CIPROFLOXACIN HCL 250 MG PO TABS: 250.0000 mg | ORAL_TABLET | Freq: Two times a day (BID) | ORAL | 0 refills | Status: DC

## 2018-04-15 ENCOUNTER — Encounter: Payer: Self-pay | Admitting: Internal Medicine

## 2018-04-15 ENCOUNTER — Ambulatory Visit (INDEPENDENT_AMBULATORY_CARE_PROVIDER_SITE_OTHER): Payer: BLUE CROSS/BLUE SHIELD | Admitting: Internal Medicine

## 2018-04-15 VITALS — BP 112/66 | HR 77 | Ht 65.0 in | Wt 181.0 lb

## 2018-04-15 DIAGNOSIS — Z0000 Encounter for general adult medical examination without abnormal findings: Secondary | ICD-10-CM | POA: Diagnosis not present

## 2018-04-15 DIAGNOSIS — Z1231 Encounter for screening mammogram for malignant neoplasm of breast: Secondary | ICD-10-CM | POA: Diagnosis not present

## 2018-04-15 DIAGNOSIS — Z1211 Encounter for screening for malignant neoplasm of colon: Secondary | ICD-10-CM

## 2018-04-15 DIAGNOSIS — Z720 Tobacco use: Secondary | ICD-10-CM

## 2018-04-15 DIAGNOSIS — R7309 Other abnormal glucose: Secondary | ICD-10-CM | POA: Diagnosis not present

## 2018-04-15 DIAGNOSIS — Z23 Encounter for immunization: Secondary | ICD-10-CM

## 2018-04-15 DIAGNOSIS — Z8711 Personal history of peptic ulcer disease: Secondary | ICD-10-CM

## 2018-04-15 LAB — POCT URINALYSIS DIPSTICK
BILIRUBIN UA: NEGATIVE
GLUCOSE UA: NEGATIVE
KETONES UA: NEGATIVE
Leukocytes, UA: NEGATIVE
Nitrite, UA: NEGATIVE
Protein, UA: NEGATIVE
SPEC GRAV UA: 1.01 (ref 1.010–1.025)
Urobilinogen, UA: 0.2 E.U./dL
pH, UA: 6 (ref 5.0–8.0)

## 2018-04-15 MED ORDER — VARENICLINE TARTRATE 0.5 MG X 11 & 1 MG X 42 PO MISC: ORAL | 0 refills | Status: DC

## 2018-04-15 MED ORDER — VARENICLINE TARTRATE 1 MG PO TABS: 1.0000 mg | ORAL_TABLET | Freq: Two times a day (BID) | ORAL | 4 refills | Status: DC

## 2018-04-15 MED ORDER — ALBUTEROL SULFATE HFA 108 (90 BASE) MCG/ACT IN AERS: 1.0000 | INHALATION_SPRAY | Freq: Four times a day (QID) | RESPIRATORY_TRACT | 2 refills | Status: DC | PRN

## 2018-04-15 NOTE — Progress Notes (Signed)
Date:  04/15/2018   Name:  Diamond Jackson   DOB:  05/18/1956   MRN:  [id number]   Chief Complaint: Annual Exam (Breast Exam. Pap in 2 more years. Flu shot. ) [name] is a 62 y.o. female who presents today for her Complete Annual Exam. She feels well. She reports exercising 5 days a week at the gym. She reports she is sleeping fairly well. Pap was normal with neg HPV in 2016.  Colonoscopy is due next year.  Mammogram is due now.  She denies breast complaints. She continue to smoke cigarettes daily. She is ready to try chantix again. She has a history of PUD and takes Pepcid daily with good control of sx.  HPI  Lab Results  Component Value Date   HGBA1C 6.2 03/21/2016    Review of Systems  Constitutional: Negative for chills, fatigue and fever.  HENT: Negative for congestion, hearing loss, tinnitus, trouble swallowing and voice change.   Eyes: Negative for visual disturbance.  Respiratory: Negative for cough, chest tightness, shortness of breath and wheezing.   Cardiovascular: Negative for chest pain, palpitations and leg swelling.  Gastrointestinal: Negative for abdominal pain, constipation, diarrhea and vomiting.  Endocrine: Negative for polydipsia and polyuria.  Genitourinary: Negative for dysuria, frequency, genital sores, vaginal bleeding and vaginal discharge.  Musculoskeletal: Negative for arthralgias, gait problem and joint swelling.  Skin: Negative for color change and rash.  Allergic/Immunologic: Positive for environmental allergies.  Neurological: Negative for dizziness, tremors, light-headedness and headaches.  Hematological: Negative for adenopathy. Does not bruise/bleed easily.  Psychiatric/Behavioral: Negative for dysphoric mood and sleep disturbance. The patient is not nervous/anxious.     Patient Active Problem List   Diagnosis Date Noted  . Allergic rhinitis, seasonal 03/22/2015  . History of measles, mumps, or rubella 03/22/2015  . Elevated hemoglobin A1c  03/22/2015  . Fibrocystic breast changes 03/22/2015  . Hx of peptic ulcer 03/22/2015  . Current tobacco use 03/22/2015    No Known Allergies  Past Surgical History:  Procedure Laterality Date  . ANKLE SURGERY Right 1984  . BREAST BIOPSY Left    benign two areas/clips  . CHOLECYSTECTOMY  2010  . CLEFT PALATE REPAIR     as an infant  . COLONOSCOPY  2010   normal    Social History   Tobacco Use  . Smoking status: Current Every Day Smoker    Packs/day: 0.25    Types: Cigarettes  . Smokeless tobacco: Never Used  Substance Use Topics  . Alcohol use: No    Alcohol/week: 0.0 standard drinks  . Drug use: No     Medication list has been reviewed and updated.  Current Meds  Medication Sig  . albuterol (PROAIR HFA) 108 (90 Base) MCG/ACT inhaler Inhale 1-2 puffs into the lungs every 6 (six) hours as needed for wheezing or shortness of breath.  . cetirizine (ZYRTEC) 10 MG tablet Take 10 mg by mouth daily as needed for allergies.  . famotidine (PEPCID) 20 MG tablet Take 20 mg by mouth 2 (two) times daily.    PHQ 2/9 Scores 04/15/2018 04/11/2017 04/10/2016 06/08/2015  PHQ - 2 Score 0 1 0 0    Physical Exam  Constitutional: She is oriented to person, place, and time. She appears well-developed and well-nourished. No distress.  HENT:  Head: Normocephalic and atraumatic.  Right Ear: Tympanic membrane and ear canal normal.  Left Ear: Tympanic membrane and ear canal normal.  Nose: Right sinus exhibits no maxillary sinus tenderness.  Left sinus exhibits no maxillary sinus tenderness.  Mouth/Throat: Uvula is midline and oropharynx is clear and moist.  Eyes: Conjunctivae and EOM are normal. Right eye exhibits no discharge. Left eye exhibits no discharge. No scleral icterus.  Neck: Normal range of motion. Carotid bruit is not present. No erythema present. No thyromegaly present.  Cardiovascular: Normal rate, regular rhythm, normal heart sounds and normal pulses.  Pulmonary/Chest:  Effort normal. No respiratory distress. She has no wheezes. Right breast exhibits no mass, no nipple discharge, no skin change and no tenderness. Left breast exhibits no mass, no nipple discharge, no skin change and no tenderness.  Abdominal: Soft. Bowel sounds are normal. There is no hepatosplenomegaly. There is no tenderness. There is no CVA tenderness.  Musculoskeletal: Normal range of motion.  Lymphadenopathy:    She has no cervical adenopathy.    She has no axillary adenopathy.  Neurological: She is alert and oriented to person, place, and time. She has normal reflexes. No cranial nerve deficit or sensory deficit.  Skin: Skin is warm, dry and intact. No rash noted.  Psychiatric: She has a normal mood and affect. Her speech is normal and behavior is normal. Thought content normal.  Nursing note and vitals reviewed.   BP 112/66 (BP Location: Right Arm, Patient Position: Sitting, Cuff Size: Normal)   Pulse 77   Ht 5\' 5"  (1.651 m)   Wt 181 lb (82.1 kg)   SpO2 97%   BMI 30.12 kg/m   Assessment and Plan: 1. Annual physical exam Continue regular exercise - POCT urinalysis dipstick - Lipid panel - TSH  2. Encounter for screening mammogram for breast cancer Schedule at [place]; Future  3. Current tobacco use - albuterol (PROAIR HFA) 108 (90 Base) MCG/ACT inhaler; Inhale 1-2 puffs into the lungs every 6 (six) hours as needed for wheezing or shortness of breath.  Dispense: 1 Inhaler; Refill: 2 - varenicline (CHANTIX STARTING MONTH PAK) 0.5 MG X 11 & 1 MG X 42 tablet; Take one 0.5 mg tablet by mouth once daily for 3 days, then increase to one 0.5 mg tablet twice daily for 4 days, then increase to one 1 mg tablet twice daily.  Dispense: 53 tablet; Refill: 0 - varenicline (CHANTIX CONTINUING MONTH PAK) 1 MG tablet; Take 1 tablet (1 mg total) by mouth 2 (two) times daily.  Dispense: 60 tablet; Refill: 4  4. Elevated hemoglobin A1c Continue exercise and healthy  diet - Comprehensive metabolic panel - Hemoglobin A1c  5. Hx of peptic ulcer Continue H2blocker - CBC with Differential/Platelet  6. Colon cancer screening - Ambulatory referral to Gastroenterology   Partially dictated using Dragon software. Any errors are unintentional.  Halina Maidens, MD [facility]  04/15/2018

## 2018-04-16 LAB — CBC WITH DIFFERENTIAL/PLATELET
BASOS: 1 %
Basophils Absolute: 0.1 10*3/uL (ref 0.0–0.2)
EOS (ABSOLUTE): 0.9 10*3/uL — ABNORMAL HIGH (ref 0.0–0.4)
EOS: 11 %
HEMATOCRIT: 43.8 % (ref 34.0–46.6)
Hemoglobin: 15.1 g/dL (ref 11.1–15.9)
Immature Grans (Abs): 0 10*3/uL (ref 0.0–0.1)
Immature Granulocytes: 0 %
Lymphocytes Absolute: 2.7 10*3/uL (ref 0.7–3.1)
Lymphs: 31 %
MCH: 30.2 pg (ref 26.6–33.0)
MCHC: 34.5 g/dL (ref 31.5–35.7)
MCV: 88 fL (ref 79–97)
MONOS ABS: 0.6 10*3/uL (ref 0.1–0.9)
Monocytes: 6 %
Neutrophils Absolute: 4.5 10*3/uL (ref 1.4–7.0)
Neutrophils: 51 %
Platelets: 216 10*3/uL (ref 150–450)
RBC: 5 x10E6/uL (ref 3.77–5.28)
RDW: 13.1 % (ref 12.3–15.4)
WBC: 8.8 10*3/uL (ref 3.4–10.8)

## 2018-04-16 LAB — COMPREHENSIVE METABOLIC PANEL
A/G RATIO: 2 (ref 1.2–2.2)
ALBUMIN: 4.4 g/dL (ref 3.6–4.8)
ALT: 10 IU/L (ref 0–32)
AST: 10 IU/L (ref 0–40)
Alkaline Phosphatase: 71 IU/L (ref 39–117)
BUN / CREAT RATIO: 15 (ref 12–28)
BUN: 15 mg/dL (ref 8–27)
Bilirubin Total: 0.2 mg/dL (ref 0.0–1.2)
CALCIUM: 9.7 mg/dL (ref 8.7–10.3)
CO2: 22 mmol/L (ref 20–29)
Chloride: 103 mmol/L (ref 96–106)
Creatinine, Ser: 0.99 mg/dL (ref 0.57–1.00)
GFR calc Af Amer: 71 mL/min/{1.73_m2} (ref >59)
GFR, EST NON AFRICAN AMERICAN: 62 mL/min/{1.73_m2} (ref >59)
Globulin, Total: 2.2 g/dL (ref 1.5–4.5)
Glucose: 91 mg/dL (ref 65–99)
POTASSIUM: 4.2 mmol/L (ref 3.5–5.2)
Sodium: 141 mmol/L (ref 134–144)
Total Protein: 6.6 g/dL (ref 6.0–8.5)

## 2018-04-16 LAB — LIPID PANEL
CHOL/HDL RATIO: 3.9 ratio (ref 0.0–4.4)
Cholesterol, Total: 152 mg/dL (ref 100–199)
HDL: 39 mg/dL — ABNORMAL LOW (ref >39)
LDL CALC: 80 mg/dL (ref 0–99)
Triglycerides: 163 mg/dL — ABNORMAL HIGH (ref 0–149)
VLDL CHOLESTEROL CAL: 33 mg/dL (ref 5–40)

## 2018-04-16 LAB — HEMOGLOBIN A1C
ESTIMATED AVERAGE GLUCOSE: 120 mg/dL
Hgb A1c MFr Bld: 5.8 % — ABNORMAL HIGH (ref 4.8–5.6)

## 2018-04-16 LAB — TSH: TSH: 1.02 u[IU]/mL (ref 0.450–4.500)

## 2018-04-29 ENCOUNTER — Encounter: Payer: Self-pay | Admitting: *Deleted

## 2018-04-29 ENCOUNTER — Encounter: Payer: Self-pay | Admitting: Internal Medicine

## 2018-04-29 ENCOUNTER — Other Ambulatory Visit: Payer: Self-pay

## 2018-04-29 DIAGNOSIS — Z1211 Encounter for screening for malignant neoplasm of colon: Secondary | ICD-10-CM

## 2018-05-07 ENCOUNTER — Encounter: Payer: Self-pay | Admitting: *Deleted

## 2018-05-07 ENCOUNTER — Other Ambulatory Visit: Payer: Self-pay

## 2018-05-14 ENCOUNTER — Ambulatory Visit
Admission: RE | Admit: 2018-05-14 | Discharge: 2018-05-14 | Disposition: A | Discharge status: Home / Self Care | Payer: BLUE CROSS/BLUE SHIELD | Source: Ambulatory Visit | Attending: Internal Medicine | Admitting: Internal Medicine

## 2018-05-14 DIAGNOSIS — Z1231 Encounter for screening mammogram for malignant neoplasm of breast: Secondary | ICD-10-CM | POA: Insufficient documentation

## 2018-05-14 NOTE — Anesthesia Preprocedure Evaluation (Addendum)
Anesthesia Evaluation  Patient identified by MRN, date of birth, ID band Patient awake    Reviewed: Allergy & Precautions, NPO status , Patient's Chart, lab work & pertinent test results  History of Anesthesia Complications (+) PONV and history of anesthetic complications  Airway Mallampati: I   Neck ROM: Full    Dental no notable dental hx.    Pulmonary asthma , Current Smoker (1 ppd),    Pulmonary exam normal breath sounds clear to auscultation       Cardiovascular Exercise Tolerance: Good negative cardio ROS Normal cardiovascular exam Rhythm:Regular Rate:Normal     Neuro/Psych negative neurological ROS     GI/Hepatic GERD  Medicated,  Endo/Other  negative endocrine ROS  Renal/GU negative Renal ROS     Musculoskeletal  (+) Arthritis ,   Abdominal   Peds  Hematology negative hematology ROS (+)   Anesthesia Other Findings   Reproductive/Obstetrics                            Anesthesia Physical Anesthesia Plan  ASA: II  Anesthesia Plan: General   Post-op Pain Management:    Induction: Intravenous  PONV Risk Score and Plan: 2 and Propofol infusion and TIVA  Airway Management Planned: Natural Airway  Additional Equipment:   Intra-op Plan:   Post-operative Plan:   Informed Consent: I have reviewed the patients History and Physical, chart, labs and discussed the procedure including the risks, benefits and alternatives for the proposed anesthesia with the patient or authorized representative who has indicated his/her understanding and acceptance.     Plan Discussed with: CRNA  Anesthesia Plan Comments:        Anesthesia Quick Evaluation

## 2018-05-15 NOTE — Discharge Instructions (Signed)
General Anesthesia, Adult, Care After °These instructions provide you with information about caring for yourself after your procedure. Your health care provider may also give you more specific instructions. Your treatment has been planned according to current medical practices, but problems sometimes occur. Call your health care provider if you have any problems or questions after your procedure. °What can I expect after the procedure? °After the procedure, it is common to have: °· Vomiting. °· A sore throat. °· Mental slowness. ° °It is common to feel: °· Nauseous. °· Cold or shivery. °· Sleepy. °· Tired. °· Sore or achy, even in parts of your body where you did not have surgery. ° °Follow these instructions at home: °For at least 24 hours after the procedure: °· Do not: °? Participate in activities where you could fall or become injured. °? Drive. °? Use heavy machinery. °? Drink alcohol. °? Take sleeping pills or medicines that cause drowsiness. °? Make important decisions or sign legal documents. °? Take care of children on your own. °· Rest. °Eating and drinking °· If you vomit, drink water, juice, or soup when you can drink without vomiting. °· Drink enough fluid to keep your urine clear or pale yellow. °· Make sure you have little or no nausea before eating solid foods. °· Follow the diet recommended by your health care provider. °General instructions °· Have a responsible adult stay with you until you are awake and alert. °· Return to your normal activities as told by your health care provider. Ask your health care provider what activities are safe for you. °· Take over-the-counter and prescription medicines only as told by your health care provider. °· If you smoke, do not smoke without supervision. °· Keep all follow-up visits as told by your health care provider. This is important. °Contact a health care provider if: °· You continue to have nausea or vomiting at home, and medicines are not helpful. °· You  cannot drink fluids or start eating again. °· You cannot urinate after 8-12 hours. °· You develop a skin rash. °· You have fever. °· You have increasing redness at the site of your procedure. °Get help right away if: °· You have difficulty breathing. °· You have chest pain. °· You have unexpected bleeding. °· You feel that you are having a life-threatening or urgent problem. °This information is not intended to replace advice given to you by your health care provider. Make sure you discuss any questions you have with your health care provider. °Document Released: 09/04/2000 Document Revised: 11/01/2015 Document Reviewed: 05/13/2015 °Elsevier Interactive Patient Education © 2018 [employer] ° °

## 2018-05-17 ENCOUNTER — Ambulatory Visit: Payer: BLUE CROSS/BLUE SHIELD | Admitting: Anesthesiology

## 2018-05-17 ENCOUNTER — Encounter
Admission: RE | Disposition: A | Discharge status: Home / Self Care | Payer: Self-pay | Source: Ambulatory Visit | Attending: Gastroenterology

## 2018-05-17 ENCOUNTER — Ambulatory Visit
Admission: RE | Admit: 2018-05-17 | Discharge: 2018-05-17 | Disposition: A | Discharge status: Home / Self Care | Payer: BLUE CROSS/BLUE SHIELD | Source: Ambulatory Visit | Attending: Gastroenterology | Admitting: Gastroenterology

## 2018-05-17 DIAGNOSIS — D123 Benign neoplasm of transverse colon: Secondary | ICD-10-CM

## 2018-05-17 DIAGNOSIS — K219 Gastro-esophageal reflux disease without esophagitis: Secondary | ICD-10-CM | POA: Diagnosis not present

## 2018-05-17 DIAGNOSIS — Z79899 Other long term (current) drug therapy: Secondary | ICD-10-CM | POA: Diagnosis not present

## 2018-05-17 DIAGNOSIS — F17210 Nicotine dependence, cigarettes, uncomplicated: Secondary | ICD-10-CM | POA: Insufficient documentation

## 2018-05-17 DIAGNOSIS — D127 Benign neoplasm of rectosigmoid junction: Secondary | ICD-10-CM

## 2018-05-17 DIAGNOSIS — Z1211 Encounter for screening for malignant neoplasm of colon: Secondary | ICD-10-CM | POA: Diagnosis present

## 2018-05-17 DIAGNOSIS — K621 Rectal polyp: Secondary | ICD-10-CM | POA: Insufficient documentation

## 2018-05-17 DIAGNOSIS — D120 Benign neoplasm of cecum: Secondary | ICD-10-CM

## 2018-05-17 DIAGNOSIS — M1990 Unspecified osteoarthritis, unspecified site: Secondary | ICD-10-CM | POA: Insufficient documentation

## 2018-05-17 DIAGNOSIS — D1779 Benign lipomatous neoplasm of other sites: Secondary | ICD-10-CM | POA: Diagnosis not present

## 2018-05-17 DIAGNOSIS — K5730 Diverticulosis of large intestine without perforation or abscess without bleeding: Secondary | ICD-10-CM | POA: Insufficient documentation

## 2018-05-17 DIAGNOSIS — J45909 Unspecified asthma, uncomplicated: Secondary | ICD-10-CM | POA: Insufficient documentation

## 2018-05-17 HISTORY — PX: COLONOSCOPY WITH PROPOFOL: SHX5780

## 2018-05-17 HISTORY — DX: Other specified postprocedural states: Z98.890

## 2018-05-17 HISTORY — DX: Unspecified osteoarthritis, unspecified site: M19.90

## 2018-05-17 HISTORY — DX: Gastro-esophageal reflux disease without esophagitis: K21.9

## 2018-05-17 HISTORY — DX: Unspecified asthma, uncomplicated: J45.909

## 2018-05-17 HISTORY — PX: POLYPECTOMY: SHX5525

## 2018-05-17 HISTORY — DX: Other specified postprocedural states: R11.2

## 2018-05-17 SURGERY — COLONOSCOPY WITH PROPOFOL
Anesthesia: General | Site: Rectum

## 2018-05-17 MED ORDER — STERILE WATER FOR IRRIGATION IR SOLN
Status: DC | PRN
  Administered 2018-05-17: 08:00:00

## 2018-05-17 MED ORDER — PROPOFOL 10 MG/ML IV BOLUS
INTRAVENOUS | Status: DC | PRN
  Administered 2018-05-17: 40 mg via INTRAVENOUS
  Administered 2018-05-17: 120 mg via INTRAVENOUS
  Administered 2018-05-17 (×5): 40 mg via INTRAVENOUS
  Administered 2018-05-17 (×2): 30 mg via INTRAVENOUS
  Administered 2018-05-17: 20 mg via INTRAVENOUS
  Administered 2018-05-17: 40 mg via INTRAVENOUS
  Administered 2018-05-17 (×2): 30 mg via INTRAVENOUS
  Administered 2018-05-17: 40 mg via INTRAVENOUS
  Administered 2018-05-17 (×2): 30 mg via INTRAVENOUS
  Administered 2018-05-17: 40 mg via INTRAVENOUS
  Administered 2018-05-17 (×4): 30 mg via INTRAVENOUS
  Administered 2018-05-17: 40 mg via INTRAVENOUS
  Administered 2018-05-17: 30 mg via INTRAVENOUS

## 2018-05-17 MED ORDER — LIDOCAINE HCL (CARDIAC) PF 100 MG/5ML IV SOSY
PREFILLED_SYRINGE | INTRAVENOUS | Status: DC | PRN
  Administered 2018-05-17: 30 mg via INTRAVENOUS

## 2018-05-17 MED ORDER — LACTATED RINGERS IV SOLN
INTRAVENOUS | Status: DC
  Administered 2018-05-17 (×2): via INTRAVENOUS

## 2018-05-17 SURGICAL SUPPLY — 18 items
CANISTER SUCT 1200ML W/VALVE (MISCELLANEOUS) ×4 IMPLANT
CLIP HMST 235XBRD CATH ROT (MISCELLANEOUS) ×16 IMPLANT
CLIP RESOLUTION 360 11X235 (MISCELLANEOUS) ×16
ELECT REM PT RETURN 9FT ADLT (ELECTROSURGICAL) ×4
ELECTRODE REM PT RTRN 9FT ADLT (ELECTROSURGICAL) ×2 IMPLANT
ELEVIEW  INJECTABLE COMP 10 (MISCELLANEOUS) ×4
GOWN CVR UNV OPN BCK APRN NK (MISCELLANEOUS) ×4 IMPLANT
GOWN ISOL THUMB LOOP REG UNIV (MISCELLANEOUS) ×4
INJECTABLE ELEVIEW COMP 10 (MISCELLANEOUS) ×4 IMPLANT
INJECTOR VARIJECT VIN23 (MISCELLANEOUS) ×2 IMPLANT
KIT ENDO PROCEDURE OLY (KITS) ×4 IMPLANT
MARKER SPOT ENDO TATTOO 5ML (MISCELLANEOUS) ×2 IMPLANT
SNARE SHORT THROW 13M SML OVAL (MISCELLANEOUS) ×4 IMPLANT
SNARE SPIRAL (MISCELLANEOUS) ×4 IMPLANT
SPOT EX ENDOSCOPIC TATTOO (MISCELLANEOUS) ×2
TRAP ETRAP POLY (MISCELLANEOUS) ×4 IMPLANT
VARIJECT INJECTOR VIN23 (MISCELLANEOUS) ×4
WATER STERILE IRR 250ML POUR (IV SOLUTION) ×4 IMPLANT

## 2018-05-17 NOTE — Op Note (Addendum)
[facility] Gastroenterology Patient Name: Diamond Jackson Procedure Date: 05/17/2018 7:57 AM MRN: [id number] Account #: [id number] Date of Birth: [birth year] Admit Type: Outpatient Age: 62 Room: [facility] OR ROOM 01 Gender: Female Note Status: Finalized Procedure:            Colonoscopy Indications:          Screening for colorectal malignant neoplasm Providers:            [name] MD, MD Referring MD:         Halina Maidens, MD (Referring MD) Medicines:            Propofol per Anesthesia Complications:        No immediate complications. Procedure:            Pre-Anesthesia Assessment:                       - Prior to the procedure, a History and Physical was                        performed, and patient medications and allergies were                        reviewed. The patient's tolerance of previous                        anesthesia was also reviewed. The risks and benefits of                        the procedure and the sedation options and risks were                        discussed with the patient. All questions were                        answered, and informed consent was obtained. Prior                        Anticoagulants: The patient has taken no previous                        anticoagulant or antiplatelet agents. ASA Grade                        Assessment: II - A patient with mild systemic disease.                        After reviewing the risks and benefits, the patient was                        deemed in satisfactory condition to undergo the                        procedure.                       After obtaining informed consent, the colonoscope was                        passed under direct vision. Throughout the procedure,  the patient's blood pressure, pulse, and oxygen                        saturations were monitored continuously. The                        Colonoscope was introduced through the anus and                         advanced to the the cecum, identified by appendiceal                        orifice and ileocecal valve. The colonoscopy was                        performed without difficulty. The patient tolerated the                        procedure well. The quality of the bowel preparation                        was excellent. Findings:      The perianal and digital rectal examinations were normal.      A 10 mm polyp was found in the cecum. The polyp was sessile. Area was       successfully injected with 4 mL saline with indigo carmine for a lift       polypectomy. The polyp was removed with a hot snare. Resection and       retrieval were complete. To prevent bleeding post-intervention, two       hemostatic clips were successfully placed (MR conditional). There was no       bleeding at the end of the procedure.      A 12 mm polyp was found in the cecum. The polyp was sessile. Area was       successfully injected with 3 mL saline with indigo carmine for a lift       polypectomy. The polyp was removed with a hot snare. Resection and       retrieval were complete.      A 3 mm polyp was found in the hepatic flexure. The polyp was sessile.       The polyp was removed with a cold biopsy forceps. Resection and       retrieval were complete.      A 20 mm polyp was found in the hepatic flexure. The polyp was sessile.       Area was successfully injected with 4 mL saline with indigo carmine for       a lift polypectomy. The polyp was removed with a hot snare. Polyp       resection was incomplete. The resected tissue was retrieved. Area was       tattooed with an injection of 2 mL of [place] ink. To prevent bleeding       post-intervention, four hemostatic clips were successfully placed (MR       conditional). There was no bleeding at the end of the procedure.      Many sessile polyps were found in the recto-sigmoid colon. The polyps       were 1 to 3 mm in size. These polyps were removed with a cold biopsy        forceps.  Polyp resection was incomplete. The resected tissue was       retrieved.      Multiple small-mouthed diverticula were found in the sigmoid colon. Impression:           - One 10 mm polyp in the cecum, removed with a hot                        snare. Resected and retrieved. Injected. Clips (MR                        conditional) were placed.                       - One 12 mm polyp in the cecum, removed with a hot                        snare. Resected and retrieved. Injected.                       - One 3 mm polyp at the hepatic flexure, removed with a                        cold biopsy forceps. Resected and retrieved.                       - One 20 mm polyp at the hepatic flexure, removed with                        a hot snare. Incomplete resection. Resected tissue                        retrieved. Injected. Tattooed. Clips (MR conditional)                        were placed.                       - Many 1 to 3 mm polyps at the recto-sigmoid colon,                        removed with a cold biopsy forceps. Incomplete                        resection. Resected tissue retrieved.                       - Diverticulosis in the sigmoid colon. Recommendation:       - Discharge patient to home.                       - Low fiber diet for 3 days.                       - Watch for signs of bleeding, fevers, chills or                        abdominal pain.                       If any of the above then go to closest [place] room.                       -  Repeat colonoscopy in 3 months for surveillance after                        piecemeal polypectomy. Procedure Code(s):    --- Professional ---                       [phone], Colonoscopy, flexible; with removal of tumor(s),                        polyp(s), or other lesion(s) by snare technique                       [id number], Colonoscopy, flexible; with directed submucosal                        injection(s), any substance                        94496, 30, Colonoscopy, flexible; with biopsy, single                        or multiple Diagnosis Code(s):    --- Professional ---                       Z12.11, Encounter for screening for malignant neoplasm                        of colon                       D12.0, Benign neoplasm of cecum                       D12.3, Benign neoplasm of transverse colon (hepatic                        flexure or splenic flexure)                       D12.7, Benign neoplasm of rectosigmoid junction CPT copyright 2018 American Medical Association. All rights reserved. The codes documented in this report are preliminary and upon coder review may  be revised to meet current compliance requirements. [name] MD, MD 05/17/2018 9:09:11 AM This report has been signed electronically. Number of Addenda: 0 Note Initiated On: 05/17/2018 7:57 AM Scope Withdrawal Time: 0 hours 47 minutes 13 seconds  Total Procedure Duration: 0 hours 55 minutes 21 seconds       [facility]

## 2018-05-17 NOTE — Transfer of Care (Signed)
Immediate Anesthesia Transfer of Care Note  Patient: Diamond Jackson  Procedure(s) Performed: COLONOSCOPY WITH BIOPSIES (N/A Rectum) POLYPECTOMY  Patient Location: PACU  Anesthesia Type: General  Level of Consciousness: awake, alert  and patient cooperative  Airway and Oxygen Therapy: Patient Spontanous Breathing and Patient connected to supplemental oxygen  Post-op Assessment: Post-op Vital signs reviewed, Patient's Cardiovascular Status Stable, Respiratory Function Stable, Patent Airway and No signs of Nausea or vomiting  Post-op Vital Signs: Reviewed and stable  Complications: No apparent anesthesia complications

## 2018-05-17 NOTE — H&P (Signed)
[name], MD [facility] [address] [place], [place] [address] Phone: [phone] Fax : [phone]  Primary Care Physician:  Glean Hess, MD Primary Gastroenterologist:  Dr. Allen Norris  Pre-Procedure History & Physical: HPI:  Diamond Jackson is a 62 y.o. female is here for a screening colonoscopy.   Past Medical History:  Diagnosis Date  . Arthritis    right ankle  . Asthma    rare- (in very dusty conditions)  . GERD (gastroesophageal reflux disease)   . PONV (postoperative nausea and vomiting)     Past Surgical History:  Procedure Laterality Date  . ANKLE SURGERY Right 1984  . BREAST BIOPSY Left    benign two areas/clips  . CHOLECYSTECTOMY  2010  . CLEFT PALATE REPAIR     as an infant  . COLONOSCOPY  2010   normal    Prior to Admission medications   Medication Sig Start Date End Date Taking? Authorizing Provider  albuterol (PROAIR HFA) 108 (90 Base) MCG/ACT inhaler Inhale 1-2 puffs into the lungs every 6 (six) hours as needed for wheezing or shortness of breath. 04/15/18  Yes Glean Hess, MD  cetirizine (ZYRTEC) 10 MG tablet Take 10 mg by mouth daily as needed for allergies.   Yes [provider]  famotidine (PEPCID) 20 MG tablet Take 20 mg by mouth 2 (two) times daily.   Yes [provider]  varenicline (CHANTIX CONTINUING MONTH PAK) 1 MG tablet Take 1 tablet (1 mg total) by mouth 2 (two) times daily. 04/15/18  Yes Glean Hess, MD  varenicline (CHANTIX STARTING MONTH PAK) 0.5 MG X 11 & 1 MG X 42 tablet Take one 0.5 mg tablet by mouth once daily for 3 days, then increase to one 0.5 mg tablet twice daily for 4 days, then increase to one 1 mg tablet twice daily. 04/15/18  Yes Glean Hess, MD    Allergies as of 04/29/2018  . (No Known Allergies)    Family History  Problem Relation Age of Onset  . Diabetes Mother   . Diabetes Brother   . Lung cancer Father   . Breast cancer Maternal Aunt 67  . Breast cancer Other 68     Social History   Socioeconomic History  . Marital status: Widowed    Spouse name: Not on file  . Number of children: Not on file  . Years of education: Not on file  . Highest education level: Not on file  Occupational History  . Not on file  Social Needs  . Financial resource strain: Not on file  . Food insecurity:    Worry: Not on file    Inability: Not on file  . Transportation needs:    Medical: Not on file    Non-medical: Not on file  Tobacco Use  . Smoking status: Current Every Day Smoker    Packs/day: 1.00    Years: 55.00    Pack years: 55.00    Types: Cigarettes  . Smokeless tobacco: Never Used  . Tobacco comment: since age 26  Substance and Sexual Activity  . Alcohol use: No    Alcohol/week: 0.0 standard drinks  . Drug use: No  . Sexual activity: Not on file  Lifestyle  . Physical activity:    Days per week: Not on file    Minutes per session: Not on file  . Stress: Not on file  Relationships  . Social connections:    Talks on phone: Not on file  Gets together: Not on file    Attends religious service: Not on file    Active member of club or organization: Not on file    Attends meetings of clubs or organizations: Not on file    Relationship status: Not on file  . Intimate partner violence:    Fear of current or ex partner: Not on file    Emotionally abused: Not on file    Physically abused: Not on file    Forced sexual activity: Not on file  Other Topics Concern  . Not on file  Social History Narrative  . Not on file    Review of Systems: See HPI, otherwise negative ROS  Physical Exam: BP 109/84   Pulse 95   Temp 98.1 F (36.7 C) (Temporal)   Resp 16   Ht 5\' 5"  (1.651 m)   Wt 79.4 kg   SpO2 97%   BMI 29.12 kg/m  General:   Alert,  pleasant and cooperative in NAD Head:  Normocephalic and atraumatic. Neck:  Supple; no masses or thyromegaly. Lungs:  Clear throughout to auscultation.    Heart:  Regular rate and rhythm. Abdomen:   Soft, nontender and nondistended. Normal bowel sounds, without guarding, and without rebound.   Neurologic:  Alert and  oriented x4;  grossly normal neurologically.  Impression/Plan: Diamond Jackson is now here to undergo a screening colonoscopy.  Risks, benefits, and alternatives regarding colonoscopy have been reviewed with the patient.  Questions have been answered.  All parties agreeable.

## 2018-05-17 NOTE — Anesthesia Procedure Notes (Signed)
Date/Time: 05/17/2018 8:02 AM Performed by: Cameron Ali, CRNA Pre-anesthesia Checklist: Patient identified, Emergency Drugs available, Suction available, Timeout performed and Patient being monitored Patient Re-evaluated:Patient Re-evaluated prior to induction Oxygen Delivery Method: Nasal cannula Placement Confirmation: positive ETCO2

## 2018-05-17 NOTE — Anesthesia Postprocedure Evaluation (Signed)
Anesthesia Post Note  Patient: Diamond Jackson  Procedure(s) Performed: COLONOSCOPY WITH BIOPSIES (N/A Rectum) POLYPECTOMY  Patient location during evaluation: PACU Anesthesia Type: General Level of consciousness: awake and alert, oriented and patient cooperative Pain management: pain level controlled Vital Signs Assessment: post-procedure vital signs reviewed and stable Respiratory status: spontaneous breathing, nonlabored ventilation and respiratory function stable Cardiovascular status: blood pressure returned to baseline and stable Postop Assessment: adequate PO intake Anesthetic complications: no    Darrin Nipper

## 2018-05-20 ENCOUNTER — Encounter: Payer: Self-pay | Admitting: Gastroenterology

## 2018-05-23 ENCOUNTER — Other Ambulatory Visit: Payer: Self-pay

## 2018-05-23 ENCOUNTER — Telehealth: Payer: Self-pay

## 2018-05-23 DIAGNOSIS — Z86010 Personal history of colonic polyps: Secondary | ICD-10-CM

## 2018-05-23 MED ORDER — PEG 3350-KCL-NA BICARB-NACL 420 G PO SOLR: ORAL | 0 refills | Status: DC

## 2018-05-23 NOTE — Telephone Encounter (Signed)
-----   Message from [name], MD sent at 05/22/2018  7:57 AM EST ----- Let the patient know that the polyps removed were precancerous and large and she needs a repeat colonoscopy in 3 months.

## 2018-05-23 NOTE — Telephone Encounter (Signed)
Pt notified of results. Pt scheduled for repeat colonoscopy at [facility] on 08/16/18.

## 2018-08-05 ENCOUNTER — Other Ambulatory Visit: Payer: Self-pay

## 2018-08-05 ENCOUNTER — Encounter: Payer: Self-pay | Admitting: *Deleted

## 2018-08-07 ENCOUNTER — Encounter: Payer: Self-pay | Admitting: Anesthesiology

## 2018-09-06 ENCOUNTER — Ambulatory Visit
Admission: RE | Admit: 2018-09-06 | Payer: PRIVATE HEALTH INSURANCE | Source: Home / Self Care | Admitting: Gastroenterology

## 2018-09-06 SURGERY — COLONOSCOPY WITH PROPOFOL
Anesthesia: Choice

## 2019-02-03 ENCOUNTER — Other Ambulatory Visit: Payer: Self-pay | Admitting: Internal Medicine

## 2019-02-03 DIAGNOSIS — Z720 Tobacco use: Secondary | ICD-10-CM

## 2019-02-03 MED ORDER — VARENICLINE TARTRATE 1 MG PO TABS: 1.0000 mg | ORAL_TABLET | Freq: Two times a day (BID) | ORAL | 0 refills | Status: DC

## 2019-04-17 ENCOUNTER — Ambulatory Visit (INDEPENDENT_AMBULATORY_CARE_PROVIDER_SITE_OTHER): Payer: 59 | Admitting: Internal Medicine

## 2019-04-17 ENCOUNTER — Encounter: Payer: Self-pay | Admitting: Internal Medicine

## 2019-04-17 ENCOUNTER — Other Ambulatory Visit: Payer: Self-pay

## 2019-04-17 VITALS — BP 114/70 | HR 91 | Temp 96.9°F | Ht 65.0 in | Wt 199.8 lb

## 2019-04-17 DIAGNOSIS — Z23 Encounter for immunization: Secondary | ICD-10-CM

## 2019-04-17 DIAGNOSIS — Z1231 Encounter for screening mammogram for malignant neoplasm of breast: Secondary | ICD-10-CM | POA: Diagnosis not present

## 2019-04-17 DIAGNOSIS — Z720 Tobacco use: Secondary | ICD-10-CM | POA: Diagnosis not present

## 2019-04-17 DIAGNOSIS — R7303 Prediabetes: Secondary | ICD-10-CM

## 2019-04-17 DIAGNOSIS — Z0000 Encounter for general adult medical examination without abnormal findings: Secondary | ICD-10-CM | POA: Diagnosis not present

## 2019-04-17 DIAGNOSIS — Z1211 Encounter for screening for malignant neoplasm of colon: Secondary | ICD-10-CM

## 2019-04-17 LAB — POCT URINALYSIS DIPSTICK
Bilirubin, UA: NEGATIVE
Blood, UA: NEGATIVE
Glucose, UA: NEGATIVE
Ketones, UA: NEGATIVE
Leukocytes, UA: NEGATIVE
Nitrite, UA: NEGATIVE
Protein, UA: NEGATIVE
Spec Grav, UA: 1.015 (ref 1.010–1.025)
Urobilinogen, UA: 0.2 E.U./dL
pH, UA: 6 (ref 5.0–8.0)

## 2019-04-17 MED ORDER — VARENICLINE TARTRATE 1 MG PO TABS: 1.0000 mg | ORAL_TABLET | Freq: Two times a day (BID) | ORAL | 0 refills | Status: DC

## 2019-04-17 MED ORDER — ALBUTEROL SULFATE HFA 108 (90 BASE) MCG/ACT IN AERS: 1.0000 | INHALATION_SPRAY | Freq: Four times a day (QID) | RESPIRATORY_TRACT | 5 refills | Status: DC | PRN

## 2019-04-17 NOTE — Patient Instructions (Addendum)
Call Dr. Allen Norris about getting the colonoscopy rescheduled.  Dr. Aubery Lapping - dermatology in [facility]

## 2019-04-17 NOTE — Progress Notes (Signed)
Date:  04/17/2019   Name:  Diamond Jackson   DOB:  [birth year]   MRN:  [id number]   Chief Complaint: Annual Exam (Breast Exam. A1C. Pap next year. Flu shot.) Diamond Jackson is a 63 y.o. female who presents today for her Complete Annual Exam. She feels well. She reports exercising none. She reports she is sleeping well.  She denies breast issues.  Mammogram  05/2018 Colonoscopy  05/2018 - needs repeat Pap smear  2016 - neg with cotesting  Nicotine Dependence Presents for follow-up visit. Symptoms are negative for fatigue. Her urge triggers include company of smokers. She smokes < 1/2 a pack of cigarettes per day. Compliance with prior treatments has been good (chantix helped get her started but it caused nausea).    Review of Systems  Constitutional: Negative for chills, fatigue and fever.  HENT: Negative for congestion, hearing loss, tinnitus, trouble swallowing and voice change.   Eyes: Negative for visual disturbance.  Respiratory: Positive for shortness of breath and wheezing. Negative for cough and chest tightness.   Cardiovascular: Negative for chest pain, palpitations and leg swelling.  Gastrointestinal: Negative for abdominal pain, constipation, diarrhea and vomiting.  Endocrine: Negative for polydipsia and polyuria.  Genitourinary: Negative for dysuria, frequency, genital sores, vaginal bleeding and vaginal discharge.  Musculoskeletal: Positive for arthralgias (left index finger). Negative for gait problem and joint swelling.  Skin: Negative for color change and rash.  Neurological: Negative for dizziness, tremors, light-headedness and headaches.  Hematological: Negative for adenopathy. Does not bruise/bleed easily.  Psychiatric/Behavioral: Negative for dysphoric mood and sleep disturbance. The patient is not nervous/anxious.     Patient Active Problem List   Diagnosis Date Noted  . Benign neoplasm of cecum   . Benign neoplasm of transverse colon   . Benign neoplasm of  rectosigmoid junction   . Allergic rhinitis, seasonal 03/22/2015  . Pre-diabetes 03/22/2015  . Fibrocystic breast changes 03/22/2015  . Hx of peptic ulcer 03/22/2015  . Current tobacco use 03/22/2015    No Known Allergies  Past Surgical History:  Procedure Laterality Date  . ANKLE SURGERY Right 1984  . BREAST BIOPSY Left    benign two areas/clips  . CHOLECYSTECTOMY  2010  . CLEFT PALATE REPAIR     as an infant  . COLONOSCOPY  2010   normal  . COLONOSCOPY WITH PROPOFOL N/A 05/17/2018   Procedure: COLONOSCOPY WITH BIOPSIES;  Surgeon: [name], MD;  Location: [place];  Service: Endoscopy;  Laterality: N/A;  . POLYPECTOMY  05/17/2018   Procedure: POLYPECTOMY;  Surgeon: [name], MD;  Location: [place];  Service: Endoscopy;;    Social History   Tobacco Use  . Smoking status: Current Every Day Smoker    Packs/day: 0.25    Years: 55.00    Pack years: 13.75    Types: Cigarettes  . Smokeless tobacco: Never Used  . Tobacco comment: since age 53- 64 ciggs daily  Substance Use Topics  . Alcohol use: No    Alcohol/week: 0.0 standard drinks  . Drug use: No     Medication list has been reviewed and updated.  Current Meds  Medication Sig  . albuterol (PROAIR HFA) 108 (90 Base) MCG/ACT inhaler Inhale 1-2 puffs into the lungs every 6 (six) hours as needed for wheezing or shortness of breath.  . cetirizine (ZYRTEC) 10 MG tablet Take 10 mg by mouth daily as needed for allergies.  . famotidine (PEPCID) 20 MG tablet Take 20 mg by mouth  2 (two) times daily.    PHQ 2/9 Scores 04/17/2019 04/15/2018 04/11/2017 04/10/2016  PHQ - 2 Score 0 0 1 0  PHQ- 9 Score 0 - - -    BP Readings from Last 3 Encounters:  04/17/19 114/70  05/17/18 127/73  04/15/18 112/66    Physical Exam Vitals signs and nursing note reviewed.  Constitutional:      General: She is not in acute distress.    Appearance: She is well-developed.  HENT:     Head: Normocephalic and  atraumatic.     Right Ear: Tympanic membrane and ear canal normal.     Left Ear: Tympanic membrane and ear canal normal.     Nose:     Right Sinus: No maxillary sinus tenderness.     Left Sinus: No maxillary sinus tenderness.  Eyes:     General: No scleral icterus.       Right eye: No discharge.        Left eye: No discharge.     Conjunctiva/sclera: Conjunctivae normal.  Neck:     Musculoskeletal: Normal range of motion. No erythema.     Thyroid: No thyromegaly.     Vascular: No carotid bruit.  Cardiovascular:     Rate and Rhythm: Normal rate and regular rhythm.     Pulses: Normal pulses.     Heart sounds: Normal heart sounds.  Pulmonary:     Effort: Pulmonary effort is normal. No respiratory distress.     Breath sounds: No wheezing.  Chest:     Breasts:        Right: Inverted nipple present. No mass, nipple discharge, skin change or tenderness.        Left: No mass, nipple discharge, skin change or tenderness.  Abdominal:     General: Bowel sounds are normal.     Palpations: Abdomen is soft.     Tenderness: There is no abdominal tenderness.  Musculoskeletal: Normal range of motion.     Right lower leg: No edema.     Left lower leg: No edema.  Lymphadenopathy:     Cervical: No cervical adenopathy.  Skin:    General: Skin is warm and dry.     Capillary Refill: Capillary refill takes less than 2 seconds.     Findings: No rash.  Neurological:     General: No focal deficit present.     Mental Status: She is alert and oriented to person, place, and time.     Cranial Nerves: No cranial nerve deficit.     Sensory: No sensory deficit.     Deep Tendon Reflexes: Reflexes are normal and symmetric.  Psychiatric:        Speech: Speech normal.        Behavior: Behavior normal.        Thought Content: Thought content normal.     Wt Readings from Last 3 Encounters:  04/17/19 199 lb 12.8 oz (90.6 kg)  05/17/18 175 lb (79.4 kg)  04/15/18 181 lb (82.1 kg)    BP 114/70   Pulse  91   Temp (!) 96.9 F (36.1 C) (Temporal)   Ht 5\' 5"  (1.651 m)   Wt 199 lb 12.8 oz (90.6 kg)   SpO2 96%   BMI 33.25 kg/m   Assessment and Plan: 1. Annual physical exam Normal exam except for weight Continue diet changes, recommend more exercise - CBC with Differential/Platelet - Lipid panel - TSH - POCT urinalysis dipstick  2. Encounter for screening mammogram for  breast cancer To be scheduled - MM 3D SCREEN BREAST BILATERAL; Future  3. Pre-diabetes Doing well with diet changes; Not checking BS and not having any worrisome symptoms to suggest progression - Comprehensive metabolic panel - Hemoglobin A1c  4. Current tobacco use Cutting back gradually - recommend having Chantix on hand to help her continue to taper with the goal to quit completely. Uses albuterol PRN - varenicline (CHANTIX CONTINUING MONTH PAK) 1 MG tablet; Take 1 tablet (1 mg total) by mouth 2 (two) times daily.  Dispense: 60 tablet; Refill: 0 - albuterol (PROAIR HFA) 108 (90 Base) MCG/ACT inhaler; Inhale 1-2 puffs into the lungs every 6 (six) hours as needed for wheezing or shortness of breath.  Dispense: 18 g; Refill: 5  5. Colon cancer screening Completed in December 2019 but GI requested a 3 mo repeat due to very large polyp that may not have been removed completely She will contact GI (cancelled earlier this year due to Covid)   Partially dictated using Editor, commissioning. Any errors are unintentional.  Halina Maidens, MD [facility]  04/17/2019

## 2019-04-18 LAB — COMPREHENSIVE METABOLIC PANEL
ALT: 15 IU/L (ref 0–32)
AST: 15 IU/L (ref 0–40)
Albumin/Globulin Ratio: 1.8 (ref 1.2–2.2)
Albumin: 4.2 g/dL (ref 3.8–4.8)
Alkaline Phosphatase: 85 IU/L (ref 39–117)
BUN/Creatinine Ratio: 16 (ref 12–28)
BUN: 15 mg/dL (ref 8–27)
Bilirubin Total: 0.3 mg/dL (ref 0.0–1.2)
CO2: 20 mmol/L (ref 20–29)
Calcium: 9.6 mg/dL (ref 8.7–10.3)
Chloride: 105 mmol/L (ref 96–106)
Creatinine, Ser: 0.96 mg/dL (ref 0.57–1.00)
GFR calc Af Amer: 73 mL/min/{1.73_m2} (ref >59)
GFR calc non Af Amer: 64 mL/min/{1.73_m2} (ref >59)
Globulin, Total: 2.4 g/dL (ref 1.5–4.5)
Glucose: 109 mg/dL — ABNORMAL HIGH (ref 65–99)
Potassium: 4.4 mmol/L (ref 3.5–5.2)
Sodium: 140 mmol/L (ref 134–144)
Total Protein: 6.6 g/dL (ref 6.0–8.5)

## 2019-04-18 LAB — CBC WITH DIFFERENTIAL/PLATELET
Basophils Absolute: 0.1 10*3/uL (ref 0.0–0.2)
Basos: 1 %
EOS (ABSOLUTE): 1 10*3/uL — ABNORMAL HIGH (ref 0.0–0.4)
Eos: 11 %
Hematocrit: 44.8 % (ref 34.0–46.6)
Hemoglobin: 15.5 g/dL (ref 11.1–15.9)
Immature Grans (Abs): 0 10*3/uL (ref 0.0–0.1)
Immature Granulocytes: 0 %
Lymphocytes Absolute: 2.7 10*3/uL (ref 0.7–3.1)
Lymphs: 29 %
MCH: 30.2 pg (ref 26.6–33.0)
MCHC: 34.6 g/dL (ref 31.5–35.7)
MCV: 87 fL (ref 79–97)
Monocytes Absolute: 0.6 10*3/uL (ref 0.1–0.9)
Monocytes: 6 %
Neutrophils Absolute: 4.8 10*3/uL (ref 1.4–7.0)
Neutrophils: 53 %
Platelets: 231 10*3/uL (ref 150–450)
RBC: 5.14 x10E6/uL (ref 3.77–5.28)
RDW: 13 % (ref 11.7–15.4)
WBC: 9.2 10*3/uL (ref 3.4–10.8)

## 2019-04-18 LAB — LIPID PANEL
Chol/HDL Ratio: 4 ratio (ref 0.0–4.4)
Cholesterol, Total: 177 mg/dL (ref 100–199)
HDL: 44 mg/dL (ref >39)
LDL Chol Calc (NIH): 107 mg/dL — ABNORMAL HIGH (ref 0–99)
Triglycerides: 146 mg/dL (ref 0–149)
VLDL Cholesterol Cal: 26 mg/dL (ref 5–40)

## 2019-04-18 LAB — HEMOGLOBIN A1C
Est. average glucose Bld gHb Est-mCnc: 134 mg/dL
Hgb A1c MFr Bld: 6.3 % — ABNORMAL HIGH (ref 4.8–5.6)

## 2019-04-18 LAB — TSH: TSH: 0.743 u[IU]/mL (ref 0.450–4.500)

## 2019-04-29 ENCOUNTER — Encounter: Payer: Self-pay | Admitting: *Deleted

## 2019-04-29 ENCOUNTER — Emergency Department: Payer: 59

## 2019-04-29 ENCOUNTER — Other Ambulatory Visit: Payer: Self-pay

## 2019-04-29 ENCOUNTER — Emergency Department
Admission: EM | Admit: 2019-04-29 | Discharge: 2019-04-29 | Disposition: A | Discharge status: Home / Self Care | Payer: 59 | Attending: Emergency Medicine | Admitting: Emergency Medicine

## 2019-04-29 DIAGNOSIS — Y9389 Activity, other specified: Secondary | ICD-10-CM | POA: Insufficient documentation

## 2019-04-29 DIAGNOSIS — Z79899 Other long term (current) drug therapy: Secondary | ICD-10-CM | POA: Diagnosis not present

## 2019-04-29 DIAGNOSIS — S4991XA Unspecified injury of right shoulder and upper arm, initial encounter: Secondary | ICD-10-CM | POA: Diagnosis present

## 2019-04-29 DIAGNOSIS — Y92018 Other place in single-family (private) house as the place of occurrence of the external cause: Secondary | ICD-10-CM | POA: Insufficient documentation

## 2019-04-29 DIAGNOSIS — S43004A Unspecified dislocation of right shoulder joint, initial encounter: Secondary | ICD-10-CM | POA: Diagnosis not present

## 2019-04-29 DIAGNOSIS — Y998 Other external cause status: Secondary | ICD-10-CM | POA: Diagnosis not present

## 2019-04-29 DIAGNOSIS — R202 Paresthesia of skin: Secondary | ICD-10-CM | POA: Diagnosis not present

## 2019-04-29 DIAGNOSIS — J45909 Unspecified asthma, uncomplicated: Secondary | ICD-10-CM | POA: Insufficient documentation

## 2019-04-29 DIAGNOSIS — W01198A Fall on same level from slipping, tripping and stumbling with subsequent striking against other object, initial encounter: Secondary | ICD-10-CM | POA: Insufficient documentation

## 2019-04-29 DIAGNOSIS — F17210 Nicotine dependence, cigarettes, uncomplicated: Secondary | ICD-10-CM | POA: Diagnosis not present

## 2019-04-29 MED ORDER — ONDANSETRON HCL 4 MG/2ML IJ SOLN
INTRAMUSCULAR | Status: AC | PRN
  Administered 2019-04-29: 4 mg via INTRAVENOUS

## 2019-04-29 MED ORDER — HYDROMORPHONE HCL 1 MG/ML IJ SOLN
1.0000 mg | Freq: Once | INTRAMUSCULAR | Status: AC
  Administered 2019-04-29: 1 mg via INTRAVENOUS
  Filled 2019-04-29: qty 1

## 2019-04-29 MED ORDER — PROPOFOL 10 MG/ML IV BOLUS
40.0000 mg | Freq: Once | INTRAVENOUS | Status: AC
  Administered 2019-04-29: 40 mg via INTRAVENOUS
  Filled 2019-04-29: qty 20

## 2019-04-29 MED ORDER — OXYCODONE HCL 5 MG PO TABS: 5.0000 mg | ORAL_TABLET | Freq: Three times a day (TID) | ORAL | 0 refills | Status: AC | PRN

## 2019-04-29 MED ORDER — ONDANSETRON HCL 4 MG/2ML IJ SOLN
4.0000 mg | Freq: Once | INTRAMUSCULAR | Status: AC
  Administered 2019-04-29: 09:00:00 4 mg via INTRAVENOUS
  Filled 2019-04-29: qty 2

## 2019-04-29 MED ORDER — KETAMINE HCL 10 MG/ML IJ SOLN
40.0000 mg | Freq: Once | INTRAMUSCULAR | Status: AC
  Administered 2019-04-29: 40 mg via INTRAVENOUS
  Filled 2019-04-29: qty 1

## 2019-04-29 MED ORDER — ONDANSETRON HCL 4 MG/2ML IJ SOLN
INTRAMUSCULAR | Status: AC
  Filled 2019-04-29: qty 2

## 2019-04-29 NOTE — ED Notes (Signed)
Pt unable to sign due to right arm dislocation, d/c instructions given and explained to pt and daughter and they bpth verbalize understanding of teaching

## 2019-04-29 NOTE — ED Provider Notes (Signed)
[facility] Emergency Department Provider Note  ____________________________________________   First MD Initiated Contact with Patient 04/29/19 0800     (approximate)  I have reviewed the triage vital signs and the nursing notes.   HISTORY  Chief Complaint Shoulder Injury    HPI Diamond Jackson is a 63 y.o. female with asthma who presents with mechanical fall onto her right shoulder this morning. She saw a mouse and freaked out and tripped and fell onto her right shoulder. She did not hit her head. No LOC. No other complaints.  She does have some tingling in her hand. Pain is severe, constant, worse with moving, better at rest.  Patient is right-handed.    Past Medical History:  Diagnosis Date  . Arthritis    right ankle  . Asthma    rare- (in very dusty conditions)  . GERD (gastroesophageal reflux disease)   . History of measles, mumps, or rubella 03/22/2015   DID have Chicken Pox. DID have Measles. DID have Mumps. DID have Rubella.    [name] Kitchen PONV (postoperative nausea and vomiting)     Patient Active Problem List   Diagnosis Date Noted  . Benign neoplasm of cecum   . Benign neoplasm of transverse colon   . Benign neoplasm of rectosigmoid junction   . Allergic rhinitis, seasonal 03/22/2015  . Pre-diabetes 03/22/2015  . Fibrocystic breast changes 03/22/2015  . Hx of peptic ulcer 03/22/2015  . Current tobacco use 03/22/2015    Past Surgical History:  Procedure Laterality Date  . ANKLE SURGERY Right 1984  . BREAST BIOPSY Left    benign two areas/clips  . CHOLECYSTECTOMY  2010  . CLEFT PALATE REPAIR     as an infant  . COLONOSCOPY  2010   normal  . COLONOSCOPY WITH PROPOFOL N/A 05/17/2018   Procedure: COLONOSCOPY WITH BIOPSIES;  Surgeon: [name], MD;  Location: [place];  Service: Endoscopy;  Laterality: N/A;  . POLYPECTOMY  05/17/2018   Procedure: POLYPECTOMY;  Surgeon: [name], MD;  Location: [place];   Service: Endoscopy;;    Prior to Admission medications   Medication Sig Start Date End Date Taking? Authorizing Provider  albuterol (PROAIR HFA) 108 (90 Base) MCG/ACT inhaler Inhale 1-2 puffs into the lungs every 6 (six) hours as needed for wheezing or shortness of breath. 04/17/19   Glean Hess, MD  cetirizine (ZYRTEC) 10 MG tablet Take 10 mg by mouth daily as needed for allergies.    [provider]  famotidine (PEPCID) 20 MG tablet Take 20 mg by mouth 2 (two) times daily.    [provider]  varenicline (CHANTIX CONTINUING MONTH PAK) 1 MG tablet Take 1 tablet (1 mg total) by mouth 2 (two) times daily. 04/17/19   Glean Hess, MD    Allergies Patient has no known allergies.  Family History  Problem Relation Age of Onset  . Diabetes Mother   . Diabetes Brother   . Lung cancer Father   . Breast cancer Maternal Aunt 62  . Breast cancer Other 48    Social History Social History   Tobacco Use  . Smoking status: Current Every Day Smoker    Packs/day: 0.25    Years: 55.00    Pack years: 13.75    Types: Cigarettes  . Smokeless tobacco: Never Used  . Tobacco comment: since age 20- 25 ciggs daily  Substance Use Topics  . Alcohol use: No    Alcohol/week: 0.0 standard drinks  .  Drug use: No      Review of Systems Constitutional: No fever/chills Eyes: No visual changes. ENT: No sore throat. Cardiovascular: Denies chest pain. Respiratory: Denies shortness of breath. Gastrointestinal: No abdominal pain.  No nausea, no vomiting.  No diarrhea.  No constipation. Genitourinary: Negative for dysuria. Musculoskeletal: Negative for back pain.  Pain in her right shoulder.  Tingling from her elbow down. Skin: Negative for rash. Neurological: Negative for headaches, focal weakness or numbness. All other ROS negative ____________________________________________   PHYSICAL EXAM:  VITAL SIGNS: ED Triage Vitals  Enc Vitals Group     BP 04/29/19 0801 129/76      Pulse Rate 04/29/19 0801 67     Resp 04/29/19 0801 16     Temp 04/29/19 0801 98.4 F (36.9 C)     Temp src --      SpO2 04/29/19 0801 94 %     Weight 04/29/19 0800 204 lb 11.2 oz (92.9 kg)     Height 04/29/19 0800 5\' 5"  (1.651 m)     Head Circumference --      Peak Flow --      Pain Score 04/29/19 0802 10     Pain Loc --      Pain Edu? --      Excl. in [place]? --     Constitutional: Alert and oriented. Well appearing and in no acute distress. Eyes: Conjunctivae are normal. EOMI. Head: Atraumatic. Nose: No congestion/rhinnorhea. Mouth/Throat: Mucous membranes are moist.   Neck: No stridor. Trachea Midline. FROM Cardiovascular: Normal rate, regular rhythm. Grossly normal heart sounds.  Good peripheral circulation. Respiratory: Normal respiratory effort.  No retractions. Lungs CTAB. Gastrointestinal: Soft and nontender. No distention. No abdominal bruits.  Musculoskeletal: pain in the right shoulder with deformity, limited ROM.  No pain from the elbow down.  Good distal pulse.  Able to flex and extend wrist.  Sensation changes on the back of her hand although still able to feel me touching.  Small sensation changes on her forearm as well. Neurologic:  Normal speech and language. No gross focal neurologic deficits are appreciated.  Skin:  Skin is warm, dry and intact. No rash noted. Psychiatric: Mood and affect are normal. Speech and behavior are normal. GU: Deferred   ____________________________________________   LABS (all labs ordered are listed, but only abnormal results are displayed)  Labs Reviewed - No data to display ____________________________________________ RADIOLOGY Robert Bellow, personally viewed and evaluated these images (plain radiographs) as part of my medical decision making, as well as reviewing the written report by the radiologist.  ED MD interpretation: X-rays concerning for fracture plus dislocation.  Repeat x-ray shows relocated shoulder.  Official  radiology report(s): Dg Shoulder Right  Result Date: 04/29/2019 CLINICAL DATA:  Pain following fall EXAM: RIGHT SHOULDER - 2+ VIEW COMPARISON:  None. FINDINGS: Oblique and Y scapular images obtained. There is a comminuted fracture of the greater tuberosity with the greater tuberosity displaced laterally and inferiorly with compared to the remainder of the proximal humerus. There are several incomplete fractures within the superior aspect of the greater tuberosity. There is a fracture along the lateral aspect of the glenoid. There is subcoracoid anterior dislocation. No appreciable arthropathy. Visualized right lung clear. IMPRESSION: Comminuted fracture right greater tuberosity with avulsion of the greater tuberosity. There is a fracture of the lateral glenoid. There is subcoracoid anterior dislocation. Electronically Signed   By: Lowella Grip III M.D.   On: 04/29/2019 09:30   Dg Shoulder Right Portable  Result Date: 04/29/2019 CLINICAL DATA:  Post relocation of right shoulder. EXAM: PORTABLE RIGHT SHOULDER COMPARISON:  04/29/2019 FINDINGS: Signs of greater tuberosity fracture are redemonstrated along with fracture of the glenoid. The right is shoulder is now located. IMPRESSION: Interval reduction of dislocation associated with right glenoid and humeral fractures. Position of greater tuberosity relative to proximal humerus is improved. Electronically Signed   By: Zetta Bills M.D.   On: 04/29/2019 11:23    ____________________________________________   PROCEDURES  Procedure(s) performed (including Critical Care):  .Sedation  Date/Time: 04/29/2019 11:57 AM Performed by: Vanessa [place], MD Authorized by: Vanessa [place], MD   Consent:    Consent obtained:  Verbal   Consent given by:  Patient   Risks discussed:  Allergic reaction, dysrhythmia, inadequate sedation, nausea, prolonged hypoxia resulting in organ damage, prolonged sedation necessitating reversal, respiratory compromise  necessitating ventilatory assistance and intubation and vomiting   Alternatives discussed:  Analgesia without sedation, anxiolysis and regional anesthesia Universal protocol:    Procedure explained and questions answered to patient or proxy's satisfaction: yes     Relevant documents present and verified: yes     Test results available and properly labeled: yes     Imaging studies available: yes     Required blood products, implants, devices, and special equipment available: yes     Site/side marked: yes     Immediately prior to procedure a time out was called: yes     Patient identity confirmation method:  Verbally with patient Indications:    Procedure necessitating sedation performed by:  Physician performing sedation Pre-sedation assessment:    Time since last food or drink:  12 hours    ASA classification: class 2 - patient with mild systemic disease     Neck mobility: normal     Mouth opening:  3 or more finger widths   Thyromental distance:  4 finger widths   Mallampati score:  I - soft palate, uvula, fauces, pillars visible   Pre-sedation assessments completed and reviewed: airway patency, cardiovascular function, hydration status, mental status, nausea/vomiting, pain level, respiratory function and temperature   Immediate pre-procedure details:    Reassessment: Patient reassessed immediately prior to procedure     Reviewed: vital signs, relevant labs/tests and NPO status     Verified: bag valve mask available, emergency equipment available, intubation equipment available, IV patency confirmed, oxygen available and suction available   Procedure details (see MAR for exact dosages):    Preoxygenation:  Nasal cannula   Sedation:  Propofol and ketamine   Intended level of sedation: deep   Intra-procedure monitoring:  Blood pressure monitoring, cardiac monitor, continuous pulse oximetry, frequent LOC assessments, frequent vital sign checks and continuous capnometry   Intra-procedure  events: none     Total Provider sedation time (minutes):  15 Post-procedure details:    Attendance: Constant attendance by certified staff until patient recovered     Recovery: Patient returned to pre-procedure baseline     Post-sedation assessments completed and reviewed: airway patency, cardiovascular function, hydration status, mental status, nausea/vomiting, pain level, respiratory function and temperature     Patient is stable for discharge or admission: yes     Patient tolerance:  Tolerated well, no immediate complications Reduction of dislocation  Date/Time: 04/29/2019 11:58 AM Performed by: Vanessa [place], MD Authorized by: Vanessa [place], MD  Consent: Verbal consent obtained. Risks and benefits: risks, benefits and alternatives were discussed Consent given by: patient Patient understanding: patient states understanding of the procedure being  performed Patient consent: the patient's understanding of the procedure matches consent given Procedure consent: procedure consent matches procedure scheduled Relevant documents: relevant documents present and verified Test results: test results available and properly labeled Imaging studies: imaging studies available Required items: required blood products, implants, devices, and special equipment available Time out: Immediately prior to procedure a "time out" was called to verify the correct patient, procedure, equipment, support staff and site/side marked as required. Preparation: Patient was prepped and draped in the usual sterile fashion. Local anesthesia used: no  Anesthesia: Local anesthesia used: no  Sedation: Patient sedated: yes Sedation type: moderate (conscious) sedation Sedatives: etomidate and propofol  Patient tolerance: patient tolerated the procedure well with no immediate complications      ____________________________________________   INITIAL IMPRESSION / ASSESSMENT AND PLAN / ED COURSE  Diamond Jackson was  evaluated in Emergency Department on 04/29/2019 for the symptoms described in the history of present illness. She was evaluated in the context of the global COVID-19 pandemic, which necessitated consideration that the patient might be at risk for infection with the SARS-CoV-2 virus that causes COVID-19. Institutional protocols and algorithms that pertain to the evaluation of patients at risk for COVID-19 are in a state of rapid change based on information released by regulatory bodies including the CDC and federal and state organizations. These policies and algorithms were followed during the patient's care in the ED.    Pt presents with mechanical fall. This is concerning for fracture/dislocation. Did not hit her head to suggest [place]. Cleared by nexus.  No C-spine tenderness to suggest cervical fracture.  No other abdominal or other extremity pain to suggest other injuries.  The fall was purely mechanical no evidence of syncope.  Patient noted to have a greater tuberosity, lateral glenoid fracture with an anterior dislocation.  Given the fractures with a dislocation will discuss with orthopedic surgery.  Discussed with orthopedic surgery Dr. Rudene Christians who is a comfortable with [place] attempting reduction.  However if unable to get then would potentially need to have it done under fluoroscopy with them.  Patient successfully reduced under sedation.  Sling was placed.  X-ray confirmed.  Dr. Rudene Christians also reviewed x-ray who recommended follow-up in 1 week.  Patient continues to have range of motion and good distal pulse.  She is a little bit of tingling sensation in the back of her hand but no numbness.  Patient is comfortable with follow-up with orthopedics.  Will give a short course of opioids.  Patient understands should not drive and should be careful to prevent additional falls.    ____________________________________________   FINAL CLINICAL IMPRESSION(S) / ED DIAGNOSES   Final diagnoses:  Dislocation of  right shoulder joint, initial encounter      MEDICATIONS GIVEN DURING THIS VISIT:  Medications  HYDROmorphone (DILAUDID) injection 1 mg (1 mg Intravenous Given 04/29/19 0835)  ondansetron (ZOFRAN) injection 4 mg (4 mg Intravenous Given 04/29/19 0835)  HYDROmorphone (DILAUDID) injection 1 mg (1 mg Intravenous Given 04/29/19 0950)  propofol (DIPRIVAN) 10 mg/mL bolus/IV push 40 mg (40 mg Intravenous Given 04/29/19 1050)  ketamine (KETALAR) injection 40 mg (40 mg Intravenous Given 04/29/19 1050)  ondansetron (ZOFRAN) injection (4 mg Intravenous Given 04/29/19 1045)     ED Discharge Orders         Ordered    oxyCODONE (ROXICODONE) 5 MG immediate release tablet  Every 8 hours PRN     04/29/19 1143           Note:  This  document was prepared using Systems analyst and may include unintentional dictation errors.   Vanessa [place], MD 04/29/19 1200

## 2019-04-29 NOTE — Discharge Instructions (Signed)
You had a fracture as well as dislocation of the right shoulder.  We put it back into place.  You will need to call orthopedic surgery tomorrow morning and get scheduled for orthopedics to see you in 1 week.  There is a chance that you may need surgery on this given the fractures and concern for injury to your rotator cuff.  You should leave the sling on.  Return to the ER for worsening pain, inability to feel your arm or any other concerns.   Comminuted fracture right greater tuberosity with avulsion of the greater tuberosity. There is a fracture of the lateral glenoid. There is subcoracoid anterior dislocation.

## 2019-04-29 NOTE — ED Notes (Signed)
Daughter at bedside.

## 2019-04-29 NOTE — ED Triage Notes (Signed)
Pt to ED after a mechanical fall on right shoulder this morning. Pt reports seeing a mouse and falling. Obvious deformity to right shoulder. Pt unable to move right arm. Right radial pulse intact.   Pt given IV fentanyl by EMS. Pain went from 10/10 to 8/10. After moving to ED treatment bed pt now reporting a 10/10 again.

## 2019-05-05 ENCOUNTER — Other Ambulatory Visit: Payer: Self-pay | Admitting: Sports Medicine

## 2019-05-05 DIAGNOSIS — S42251A Displaced fracture of greater tuberosity of right humerus, initial encounter for closed fracture: Secondary | ICD-10-CM

## 2019-05-05 DIAGNOSIS — S43014A Anterior dislocation of right humerus, initial encounter: Secondary | ICD-10-CM

## 2019-05-05 DIAGNOSIS — M25511 Pain in right shoulder: Secondary | ICD-10-CM

## 2019-05-05 DIAGNOSIS — W19XXXA Unspecified fall, initial encounter: Secondary | ICD-10-CM

## 2019-05-05 DIAGNOSIS — S42144A Nondisplaced fracture of glenoid cavity of scapula, right shoulder, initial encounter for closed fracture: Secondary | ICD-10-CM

## 2019-05-14 IMAGING — MG DIGITAL SCREENING BILATERAL MAMMOGRAM WITH TOMO AND CAD
8 series · 8 of 24 positions shown · non-contrast
Comparison: Previous exam(s).

CLINICAL DATA: Screening.

EXAM:
DIGITAL SCREENING BILATERAL MAMMOGRAM WITH TOMO AND CAD

[L MLO synth-2D]
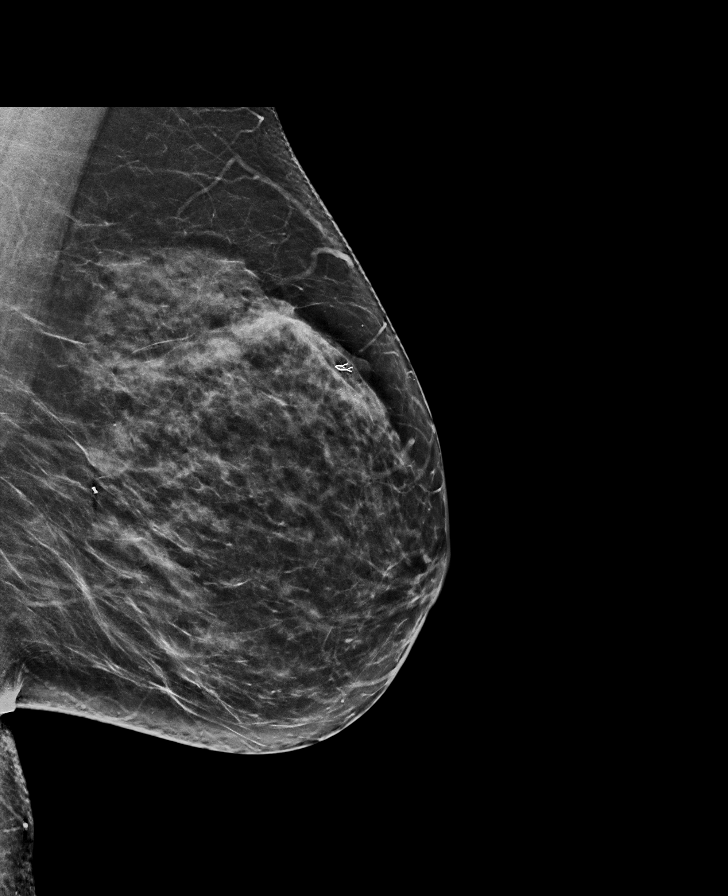

[R CC synth-2D]
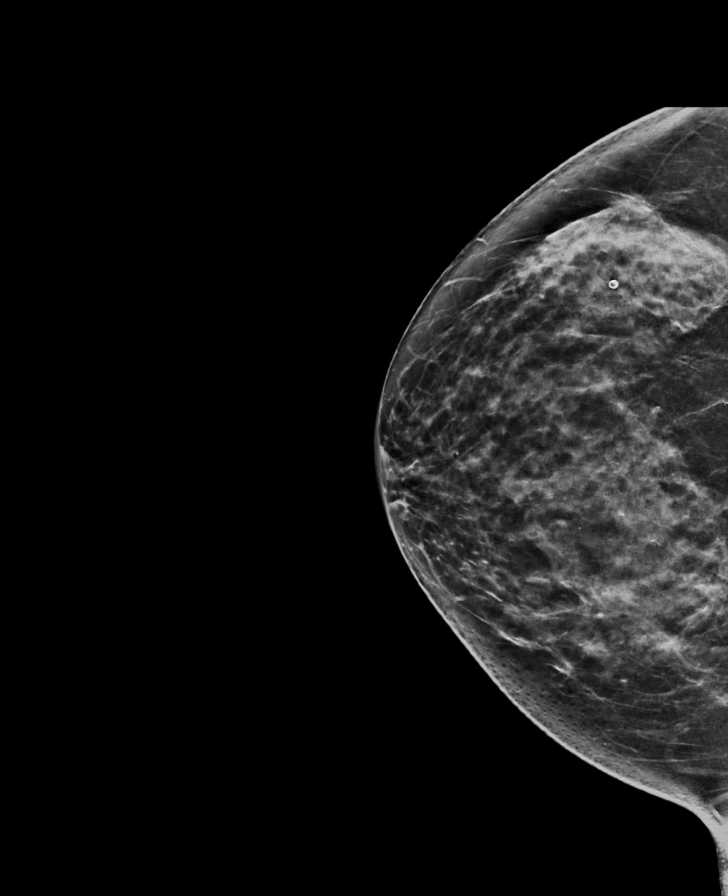

[L CC synth-2D]
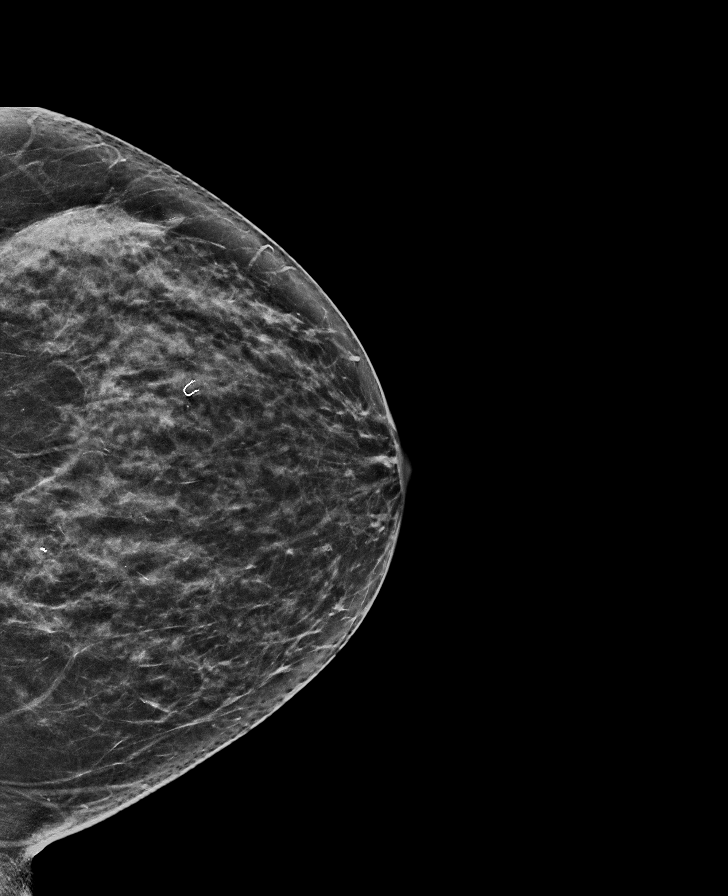

[R MLO synth-2D]
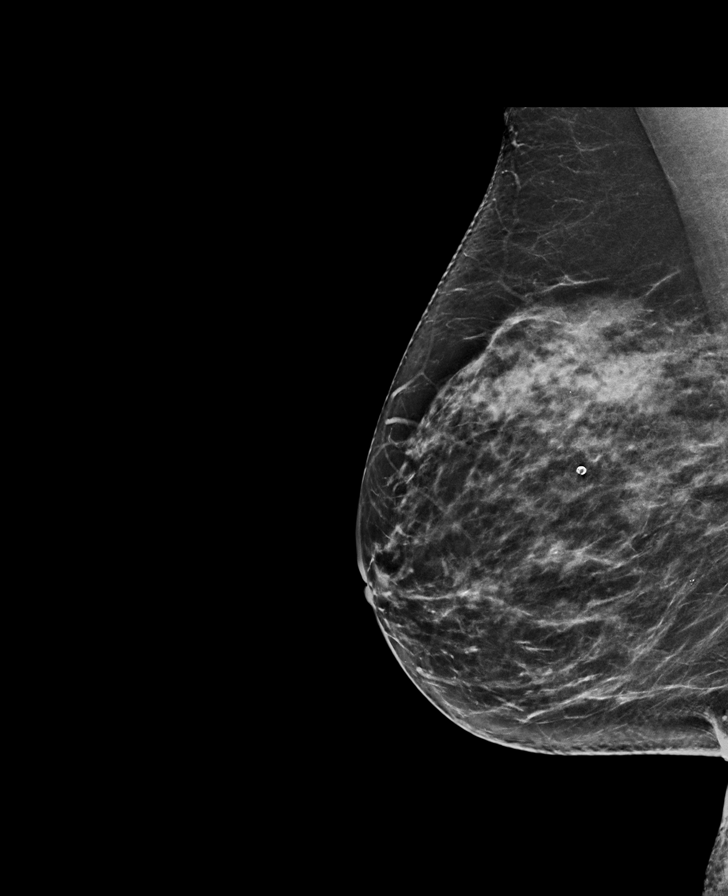

[L MLO tomo · tomo slice 37/74.0]
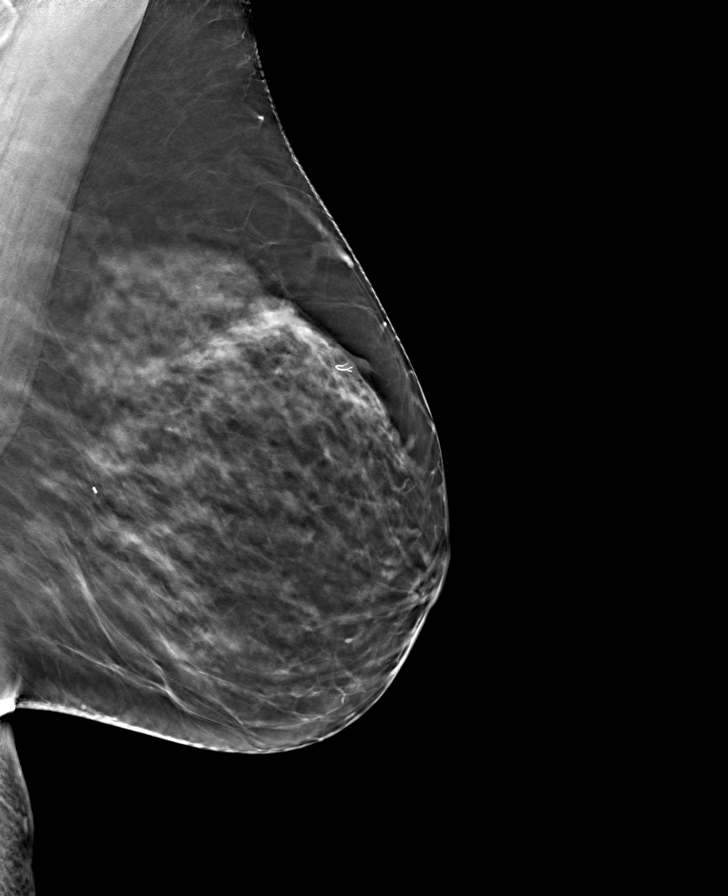

[L CC tomo · tomo slice 33/65.0]
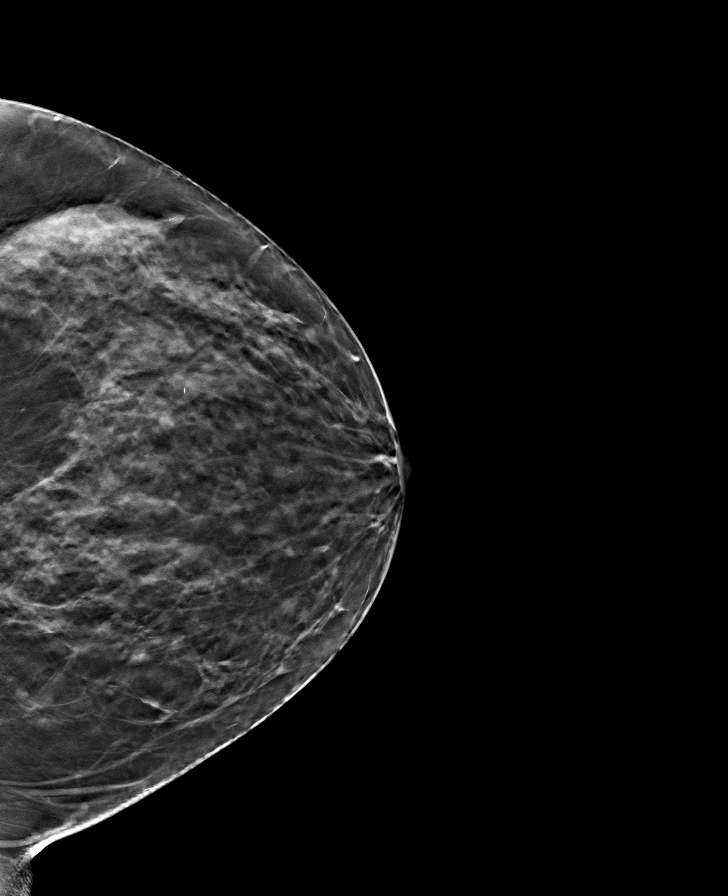

[R MLO tomo · tomo slice 38/75.0]
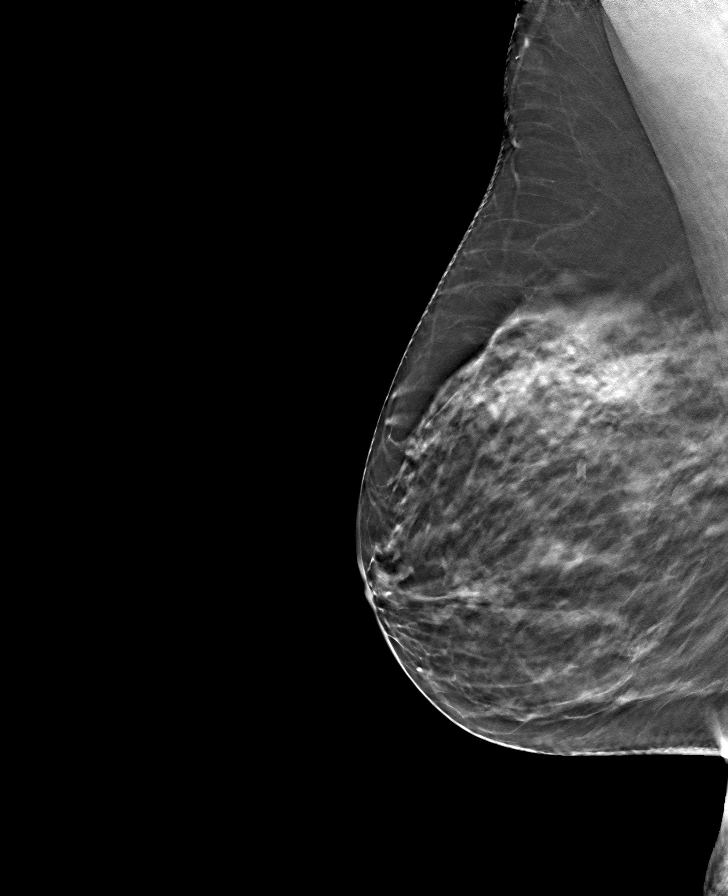

[R CC tomo · tomo slice 37/72.0]
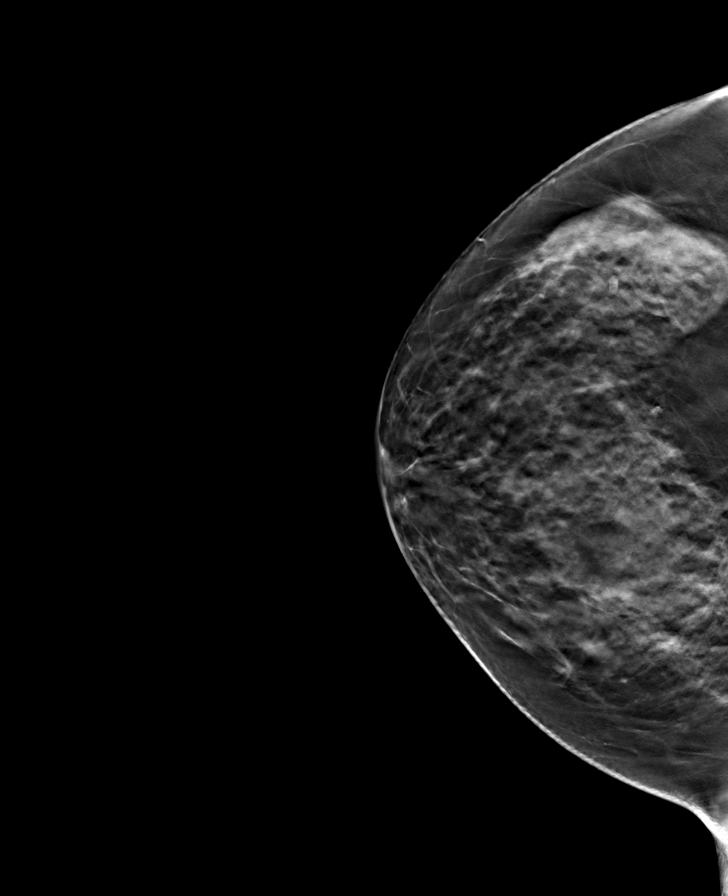

[8 of 24 positions shown; findings below may reference images not displayed]

ACR Breast Density Category c: The breast tissue is heterogeneously
dense, which may obscure small masses.
FINDINGS: There are no findings suspicious for malignancy. Images were
processed with CAD.
IMPRESSION: No mammographic evidence of malignancy. A result letter of this
screening mammogram will be mailed directly to the patient.

RECOMMENDATION:
Screening mammogram in one year. (Code:[id number]-LHB)

BI-RADS CATEGORY  1: Negative.

## 2019-05-16 ENCOUNTER — Other Ambulatory Visit: Payer: Self-pay

## 2019-05-16 ENCOUNTER — Ambulatory Visit
Admission: RE | Admit: 2019-05-16 | Discharge: 2019-05-16 | Disposition: A | Discharge status: Home / Self Care | Payer: 59 | Source: Ambulatory Visit | Attending: Sports Medicine | Admitting: Sports Medicine

## 2019-05-16 DIAGNOSIS — W19XXXD Unspecified fall, subsequent encounter: Secondary | ICD-10-CM | POA: Diagnosis not present

## 2019-05-16 DIAGNOSIS — W19XXXA Unspecified fall, initial encounter: Secondary | ICD-10-CM

## 2019-05-16 DIAGNOSIS — S42251D Displaced fracture of greater tuberosity of right humerus, subsequent encounter for fracture with routine healing: Secondary | ICD-10-CM | POA: Insufficient documentation

## 2019-05-16 DIAGNOSIS — R937 Abnormal findings on diagnostic imaging of other parts of musculoskeletal system: Secondary | ICD-10-CM | POA: Insufficient documentation

## 2019-05-16 DIAGNOSIS — S42251A Displaced fracture of greater tuberosity of right humerus, initial encounter for closed fracture: Secondary | ICD-10-CM

## 2019-05-16 DIAGNOSIS — S43014A Anterior dislocation of right humerus, initial encounter: Secondary | ICD-10-CM

## 2019-05-16 DIAGNOSIS — M25511 Pain in right shoulder: Secondary | ICD-10-CM

## 2019-05-16 DIAGNOSIS — S42144A Nondisplaced fracture of glenoid cavity of scapula, right shoulder, initial encounter for closed fracture: Secondary | ICD-10-CM

## 2019-05-19 ENCOUNTER — Ambulatory Visit: Payer: 59

## 2019-05-20 ENCOUNTER — Ambulatory Visit: Payer: PRIVATE HEALTH INSURANCE

## 2019-05-29 ENCOUNTER — Ambulatory Visit (INDEPENDENT_AMBULATORY_CARE_PROVIDER_SITE_OTHER): Payer: 59

## 2019-05-29 ENCOUNTER — Other Ambulatory Visit: Payer: Self-pay

## 2019-05-29 DIAGNOSIS — Z23 Encounter for immunization: Secondary | ICD-10-CM | POA: Diagnosis not present

## 2019-06-25 ENCOUNTER — Other Ambulatory Visit: Payer: Self-pay

## 2019-06-25 ENCOUNTER — Ambulatory Visit
Admission: RE | Admit: 2019-06-25 | Discharge: 2019-06-25 | Disposition: A | Discharge status: Home / Self Care | Payer: 59 | Source: Ambulatory Visit | Attending: Internal Medicine | Admitting: Internal Medicine

## 2019-06-25 DIAGNOSIS — Z1231 Encounter for screening mammogram for malignant neoplasm of breast: Secondary | ICD-10-CM | POA: Diagnosis present

## 2019-08-20 ENCOUNTER — Other Ambulatory Visit: Payer: Self-pay

## 2019-08-20 ENCOUNTER — Telehealth: Payer: Self-pay

## 2019-08-20 DIAGNOSIS — Z86010 Personal history of colonic polyps: Secondary | ICD-10-CM

## 2019-08-20 NOTE — Telephone Encounter (Signed)
Gastroenterology Pre-Procedure Review  Request Date: Monday April 19th Requesting Physician: Dr. Allen Norris  PATIENT REVIEW QUESTIONS: The patient responded to the following health history questions as indicated:    1. Are you having any GI issues? no 2. Do you have a personal history of Polyps? yes 3. Do you have a family history of Colon Cancer or Polyps? no 4. Diabetes Mellitus? no 5. Joint replacements in the past 12 months?no 6. Major health problems in the past 3 months?no 7. Any artificial heart valves, MVP, or defibrillator?no    MEDICATIONS & ALLERGIES:    Patient reports the following regarding taking any anticoagulation/antiplatelet therapy:   Plavix, Coumadin, Eliquis, Xarelto, Lovenox, Pradaxa, Brilinta, or Effient? no Aspirin? no  Patient confirms/reports the following medications:  Current Outpatient Medications  Medication Sig Dispense Refill  . albuterol (PROAIR HFA) 108 (90 Base) MCG/ACT inhaler Inhale 1-2 puffs into the lungs every 6 (six) hours as needed for wheezing or shortness of breath. 18 g 5  . cetirizine (ZYRTEC) 10 MG tablet Take 10 mg by mouth daily as needed for allergies.    . famotidine (PEPCID) 20 MG tablet Take 20 mg by mouth 2 (two) times daily.    . varenicline (CHANTIX CONTINUING MONTH PAK) 1 MG tablet Take 1 tablet (1 mg total) by mouth 2 (two) times daily. 60 tablet 0   No current facility-administered medications for this visit.    Patient confirms/reports the following allergies:  No Known Allergies  No orders of the defined types were placed in this encounter.   AUTHORIZATION INFORMATION Primary Insurance: 1D#: Group #:  Secondary Insurance: 1D#: Group #:  SCHEDULE INFORMATION: Date: Monday 09/29/19 Time: [place]

## 2019-08-27 ENCOUNTER — Other Ambulatory Visit: Payer: Self-pay

## 2019-08-27 ENCOUNTER — Ambulatory Visit (INDEPENDENT_AMBULATORY_CARE_PROVIDER_SITE_OTHER): Payer: 59

## 2019-08-27 DIAGNOSIS — Z23 Encounter for immunization: Secondary | ICD-10-CM | POA: Diagnosis not present

## 2019-08-27 NOTE — Progress Notes (Signed)
Administered shingrix

## 2019-09-18 ENCOUNTER — Ambulatory Visit: Payer: 59 | Attending: Internal Medicine

## 2019-09-18 DIAGNOSIS — Z23 Encounter for immunization: Secondary | ICD-10-CM

## 2019-09-18 NOTE — Progress Notes (Signed)
   Covid-19 Vaccination Clinic  Name:  Diamond Jackson    MRN: [id number] DOB: [birth year]  09/18/2019  [name] was observed post Covid-19 immunization for 15 minutes without incident. She was provided with Vaccine Information Sheet and instruction to access the V-Safe system.   [name] was instructed to call 911 with any severe reactions post vaccine: [name] Kitchen Difficulty breathing  . Swelling of face and throat  . A fast heartbeat  . A bad rash all over body  . Dizziness and weakness   Immunizations Administered    Name Date Dose VIS Date Route   Pfizer COVID-19 Vaccine 09/18/2019  8:27 AM 0.3 mL 05/23/2019 Intramuscular   Manufacturer: [place]   Lot: [id number]   [place]: [id number]

## 2019-09-22 ENCOUNTER — Other Ambulatory Visit: Payer: Self-pay

## 2019-09-22 ENCOUNTER — Encounter: Payer: Self-pay | Admitting: Gastroenterology

## 2019-09-25 ENCOUNTER — Other Ambulatory Visit: Payer: Self-pay

## 2019-09-25 ENCOUNTER — Other Ambulatory Visit
Admission: RE | Admit: 2019-09-25 | Discharge: 2019-09-25 | Disposition: A | Discharge status: Home / Self Care | Payer: 59 | Source: Ambulatory Visit | Attending: Gastroenterology | Admitting: Gastroenterology

## 2019-09-25 DIAGNOSIS — Z01812 Encounter for preprocedural laboratory examination: Secondary | ICD-10-CM | POA: Diagnosis not present

## 2019-09-25 DIAGNOSIS — Z20822 Contact with and (suspected) exposure to covid-19: Secondary | ICD-10-CM | POA: Diagnosis not present

## 2019-09-25 LAB — SARS CORONAVIRUS 2 (TAT 6-24 HRS): SARS Coronavirus 2: NEGATIVE

## 2019-09-26 NOTE — Discharge Instructions (Signed)
General Anesthesia, Adult, Care After This sheet gives you information about how to care for yourself after your procedure. Your health care provider may also give you more specific instructions. If you have problems or questions, contact your health care provider. What can I expect after the procedure? After the procedure, the following side effects are common:  Pain or discomfort at the IV site.  Nausea.  Vomiting.  Sore throat.  Trouble concentrating.  Feeling cold or chills.  Weak or tired.  Sleepiness and fatigue.  Soreness and body aches. These side effects can affect parts of the body that were not involved in surgery. Follow these instructions at home:  For at least 24 hours after the procedure:  Have a responsible adult stay with you. It is important to have someone help care for you until you are awake and alert.  Rest as needed.  Do not: ? Participate in activities in which you could fall or become injured. ? Drive. ? Use heavy machinery. ? Drink alcohol. ? Take sleeping pills or medicines that cause drowsiness. ? Make important decisions or sign legal documents. ? Take care of children on your own. Eating and drinking  Follow any instructions from your health care provider about eating or drinking restrictions.  When you feel hungry, start by eating small amounts of foods that are soft and easy to digest (bland), such as toast. Gradually return to your regular diet.  Drink enough fluid to keep your urine pale yellow.  If you vomit, rehydrate by drinking water, juice, or clear broth. General instructions  If you have sleep apnea, surgery and certain medicines can increase your risk for breathing problems. Follow instructions from your health care provider about wearing your sleep device: ? Anytime you are sleeping, including during daytime naps. ? While taking prescription pain medicines, sleeping medicines, or medicines that make you drowsy.  Return to  your normal activities as told by your health care provider. Ask your health care provider what activities are safe for you.  Take over-the-counter and prescription medicines only as told by your health care provider.  If you smoke, do not smoke without supervision.  Keep all follow-up visits as told by your health care provider. This is important. Contact a health care provider if:  You have nausea or vomiting that does not get better with medicine.  You cannot eat or drink without vomiting.  You have pain that does not get better with medicine.  You are unable to pass urine.  You develop a skin rash.  You have a fever.  You have redness around your IV site that gets worse. Get help right away if:  You have difficulty breathing.  You have chest pain.  You have blood in your urine or stool, or you vomit blood. Summary  After the procedure, it is common to have a sore throat or nausea. It is also common to feel tired.  Have a responsible adult stay with you for the first 24 hours after general anesthesia. It is important to have someone help care for you until you are awake and alert.  When you feel hungry, start by eating small amounts of foods that are soft and easy to digest (bland), such as toast. Gradually return to your regular diet.  Drink enough fluid to keep your urine pale yellow.  Return to your normal activities as told by your health care provider. Ask your health care provider what activities are safe for you. This information is not   intended to replace advice given to you by your health care provider. Make sure you discuss any questions you have with your health care provider. Document Revised: 06/01/2017 Document Reviewed: 01/12/2017 Elsevier Patient Education  2020 [employer]  

## 2019-09-29 ENCOUNTER — Ambulatory Visit: Payer: 59 | Admitting: Anesthesiology

## 2019-09-29 ENCOUNTER — Other Ambulatory Visit: Payer: Self-pay

## 2019-09-29 ENCOUNTER — Encounter: Payer: Self-pay | Admitting: Gastroenterology

## 2019-09-29 ENCOUNTER — Ambulatory Visit
Admission: RE | Admit: 2019-09-29 | Discharge: 2019-09-29 | Disposition: A | Discharge status: Home / Self Care | Payer: 59 | Attending: Gastroenterology | Admitting: Gastroenterology

## 2019-09-29 ENCOUNTER — Encounter
Admission: RE | Disposition: A | Discharge status: Home / Self Care | Payer: Self-pay | Source: Home / Self Care | Attending: Gastroenterology

## 2019-09-29 DIAGNOSIS — Z79899 Other long term (current) drug therapy: Secondary | ICD-10-CM | POA: Insufficient documentation

## 2019-09-29 DIAGNOSIS — K648 Other hemorrhoids: Secondary | ICD-10-CM | POA: Insufficient documentation

## 2019-09-29 DIAGNOSIS — K219 Gastro-esophageal reflux disease without esophagitis: Secondary | ICD-10-CM | POA: Insufficient documentation

## 2019-09-29 DIAGNOSIS — F17210 Nicotine dependence, cigarettes, uncomplicated: Secondary | ICD-10-CM | POA: Diagnosis not present

## 2019-09-29 DIAGNOSIS — Z860100 Personal history of colon polyps, unspecified: Secondary | ICD-10-CM

## 2019-09-29 DIAGNOSIS — Z1211 Encounter for screening for malignant neoplasm of colon: Secondary | ICD-10-CM | POA: Insufficient documentation

## 2019-09-29 DIAGNOSIS — M19071 Primary osteoarthritis, right ankle and foot: Secondary | ICD-10-CM | POA: Insufficient documentation

## 2019-09-29 DIAGNOSIS — K635 Polyp of colon: Secondary | ICD-10-CM | POA: Diagnosis not present

## 2019-09-29 DIAGNOSIS — E669 Obesity, unspecified: Secondary | ICD-10-CM | POA: Diagnosis not present

## 2019-09-29 DIAGNOSIS — J45909 Unspecified asthma, uncomplicated: Secondary | ICD-10-CM | POA: Insufficient documentation

## 2019-09-29 DIAGNOSIS — Z6832 Body mass index (BMI) 32.0-32.9, adult: Secondary | ICD-10-CM | POA: Insufficient documentation

## 2019-09-29 DIAGNOSIS — K5730 Diverticulosis of large intestine without perforation or abscess without bleeding: Secondary | ICD-10-CM | POA: Insufficient documentation

## 2019-09-29 HISTORY — PX: POLYPECTOMY: SHX5525

## 2019-09-29 HISTORY — PX: COLONOSCOPY WITH PROPOFOL: SHX5780

## 2019-09-29 SURGERY — COLONOSCOPY WITH PROPOFOL
Anesthesia: General | Site: Rectum

## 2019-09-29 MED ORDER — ONDANSETRON HCL 4 MG/2ML IJ SOLN
INTRAMUSCULAR | Status: DC | PRN
  Administered 2019-09-29: 4 mg via INTRAVENOUS

## 2019-09-29 MED ORDER — ONDANSETRON HCL 4 MG/2ML IJ SOLN: 4.0000 mg | Freq: Once | INTRAMUSCULAR | Status: DC | PRN

## 2019-09-29 MED ORDER — PROPOFOL 10 MG/ML IV BOLUS
INTRAVENOUS | Status: DC | PRN
  Administered 2019-09-29: 200 mg via INTRAVENOUS
  Administered 2019-09-29: 150 mg via INTRAVENOUS

## 2019-09-29 MED ORDER — ACETAMINOPHEN 160 MG/5ML PO SOLN: 325.0000 mg | ORAL | Status: DC | PRN

## 2019-09-29 MED ORDER — STERILE WATER FOR IRRIGATION IR SOLN
Status: DC | PRN
  Administered 2019-09-29: 50 mL

## 2019-09-29 MED ORDER — LACTATED RINGERS IV SOLN: INTRAVENOUS | Status: DC

## 2019-09-29 MED ORDER — ACETAMINOPHEN 325 MG PO TABS: 325.0000 mg | ORAL_TABLET | ORAL | Status: DC | PRN

## 2019-09-29 MED ORDER — GLYCOPYRROLATE 0.2 MG/ML IJ SOLN
INTRAMUSCULAR | Status: DC | PRN
  Administered 2019-09-29: .2 mg via INTRAVENOUS

## 2019-09-29 MED ORDER — LIDOCAINE HCL (CARDIAC) PF 100 MG/5ML IV SOSY
PREFILLED_SYRINGE | INTRAVENOUS | Status: DC | PRN
  Administered 2019-09-29: 50 mg via INTRAVENOUS

## 2019-09-29 SURGICAL SUPPLY — 18 items
CLIP HMST 235XBRD CATH ROT (MISCELLANEOUS) ×2 IMPLANT
CLIP RESOLUTION 360 11X235 (MISCELLANEOUS) ×4
ELECT REM PT RETURN 9FT ADLT (ELECTROSURGICAL) ×3
ELECTRODE REM PT RTRN 9FT ADLT (ELECTROSURGICAL) ×1 IMPLANT
ELEVIEW  INJECTABLE COMP 10 (MISCELLANEOUS) ×2
FORCEPS BIOP RAD 4 LRG CAP 4 (CUTTING FORCEPS) ×3 IMPLANT
GOWN CVR UNV OPN BCK APRN NK (MISCELLANEOUS) ×2 IMPLANT
GOWN ISOL THUMB LOOP REG UNIV (MISCELLANEOUS) ×4
INJECTABLE ELEVIEW COMP 10 (MISCELLANEOUS) ×1 IMPLANT
INJECTOR VARIJECT VIN23 (MISCELLANEOUS) ×1 IMPLANT
KIT ENDO PROCEDURE OLY (KITS) ×3 IMPLANT
MANIFOLD 4PT FOR NEPTUNE1 (MISCELLANEOUS) ×3 IMPLANT
SNARE LASSO HEX 3 IN 1 (INSTRUMENTS) ×3 IMPLANT
SNARE SHORT THROW 13M SML OVAL (MISCELLANEOUS) ×3 IMPLANT
SNARE SPIRAL (MISCELLANEOUS) ×3 IMPLANT
TRAP ETRAP POLY (MISCELLANEOUS) ×3 IMPLANT
VARIJECT INJECTOR VIN23 (MISCELLANEOUS) ×3
WATER STERILE IRR 250ML POUR (IV SOLUTION) ×3 IMPLANT

## 2019-09-29 NOTE — Anesthesia Procedure Notes (Signed)
Procedure Name: MAC Performed by: Izetta Dakin, CRNA Pre-anesthesia Checklist: Patient identified, Emergency Drugs available, Suction available, Patient being monitored and Timeout performed Patient Re-evaluated:Patient Re-evaluated prior to induction Oxygen Delivery Method: Non-rebreather mask

## 2019-09-29 NOTE — H&P (Signed)
[name], MD [facility] [address] [place], [place] [address] Phone:[phone] Fax : [phone]  Primary Care Physician:  Glean Hess, MD Primary Gastroenterologist:  Dr. Allen Norris  Pre-Procedure History & Physical: HPI:  Diamond Jackson is a 64 y.o. female is here for an colonoscopy.   Past Medical History:  Diagnosis Date  . Arthritis    right ankle  . Asthma    rare- (in very dusty conditions)  . GERD (gastroesophageal reflux disease)   . History of measles, mumps, or rubella 03/22/2015   DID have Chicken Pox. DID have Measles. DID have Mumps. DID have Rubella.    [name] Kitchen PONV (postoperative nausea and vomiting)     Past Surgical History:  Procedure Laterality Date  . ANKLE SURGERY Right 1984  . BREAST BIOPSY Left    benign two areas/clips  . CHOLECYSTECTOMY  2010  . CLEFT PALATE REPAIR     as an infant  . COLONOSCOPY  2010   normal  . COLONOSCOPY WITH PROPOFOL N/A 05/17/2018   Procedure: COLONOSCOPY WITH BIOPSIES;  Surgeon: [name], MD;  Location: [place];  Service: Endoscopy;  Laterality: N/A;  . POLYPECTOMY  05/17/2018   Procedure: POLYPECTOMY;  Surgeon: [name], MD;  Location: [place];  Service: Endoscopy;;    Prior to Admission medications   Medication Sig Start Date End Date Taking? Authorizing Provider  albuterol (PROAIR HFA) 108 (90 Base) MCG/ACT inhaler Inhale 1-2 puffs into the lungs every 6 (six) hours as needed for wheezing or shortness of breath. 04/17/19  Yes Glean Hess, MD  cetirizine (ZYRTEC) 10 MG tablet Take 10 mg by mouth daily as needed for allergies.   Yes [provider]  famotidine (PEPCID) 20 MG tablet Take 20 mg by mouth 2 (two) times daily.   Yes [provider]  varenicline (CHANTIX CONTINUING MONTH PAK) 1 MG tablet Take 1 tablet (1 mg total) by mouth 2 (two) times daily. Patient not taking: Reported on 09/22/2019 04/17/19   Glean Hess, MD    Allergies as of 08/20/2019    . (No Known Allergies)    Family History  Problem Relation Age of Onset  . Diabetes Mother   . Diabetes Brother   . Lung cancer Father   . Breast cancer Maternal Aunt 39  . Breast cancer Other 28    Social History   Socioeconomic History  . Marital status: Widowed    Spouse name: Not on file  . Number of children: Not on file  . Years of education: Not on file  . Highest education level: Not on file  Occupational History  . Not on file  Tobacco Use  . Smoking status: Current Every Day Smoker    Packs/day: 0.25    Years: 55.00    Pack years: 13.75    Types: Cigarettes  . Smokeless tobacco: Never Used  . Tobacco comment: since age 28- 106 cigs daily  Substance and Sexual Activity  . Alcohol use: No    Alcohol/week: 0.0 standard drinks  . Drug use: No  . Sexual activity: Yes  Other Topics Concern  . Not on file  Social History Narrative  . Not on file   Social Determinants of Health   Financial Resource Strain:   . Difficulty of Paying Living Expenses:   Food Insecurity:   . Worried About Charity fundraiser in the Last Year:   . [place] in the Last Year:  Transportation Needs:   . Film/video editor (Medical):   [name] Kitchen Lack of Transportation (Non-Medical):   Physical Activity:   . Days of Exercise per Week:   . Minutes of Exercise per Session:   Stress:   . Feeling of Stress :   Social Connections:   . Frequency of Communication with Friends and Family:   . Frequency of Social Gatherings with Friends and Family:   . Attends Religious Services:   . Active Member of Clubs or Organizations:   . Attends Archivist Meetings:   [name] Kitchen Marital Status:   Intimate Partner Violence:   . Fear of Current or Ex-Partner:   . Emotionally Abused:   [name] Kitchen Physically Abused:   . Sexually Abused:     Review of Systems: See HPI, otherwise negative ROS  Physical Exam: BP 132/72   Pulse 92   Temp 97.6 F (36.4 C) (Temporal)   Resp 16   Ht 5\' 5"  (1.651 m)    Wt 88 kg   SpO2 97%   BMI 32.28 kg/m  General:   Alert,  pleasant and cooperative in NAD Head:  Normocephalic and atraumatic. Neck:  Supple; no masses or thyromegaly. Lungs:  Clear throughout to auscultation.    Heart:  Regular rate and rhythm. Abdomen:  Soft, nontender and nondistended. Normal bowel sounds, without guarding, and without rebound.   Neurologic:  Alert and  oriented x4;  grossly normal neurologically.  Impression/Plan: Diamond Jackson is here for an colonoscopy to be performed for history of adenomatous polyps 05/17/2018  Risks, benefits, limitations, and alternatives regarding  colonoscopy have been reviewed with the patient.  Questions have been answered.  All parties agreeable.   [name], MD  09/29/2019, 8:18 AM

## 2019-09-29 NOTE — Anesthesia Preprocedure Evaluation (Signed)
Anesthesia Evaluation  Patient identified by MRN, date of birth, ID band Patient awake    History of Anesthesia Complications (+) PONV and history of anesthetic complications  Airway Mallampati: II  TM Distance: >3 FB Neck ROM: Full    Dental no notable dental hx.    Pulmonary asthma , Current SmokerPatient did not abstain from smoking.,    Pulmonary exam normal        Cardiovascular negative cardio ROS Normal cardiovascular exam     Neuro/Psych negative neurological ROS     GI/Hepatic Neg liver ROS, GERD  Medicated,  Endo/Other  Obesity, BMI 32  Renal/GU negative Renal ROS     Musculoskeletal   Abdominal   Peds  Hematology negative hematology ROS (+)   Anesthesia Other Findings   Reproductive/Obstetrics                             Anesthesia Physical Anesthesia Plan  ASA: II  Anesthesia Plan: General   Post-op Pain Management:    Induction: Intravenous  PONV Risk Score and Plan: 3 and TIVA, Propofol infusion and Treatment may vary due to age or medical condition  Airway Management Planned: Nasal Cannula and Natural Airway  Additional Equipment: None  Intra-op Plan:   Post-operative Plan:   Informed Consent: I have reviewed the patients History and Physical, chart, labs and discussed the procedure including the risks, benefits and alternatives for the proposed anesthesia with the patient or authorized representative who has indicated his/her understanding and acceptance.       Plan Discussed with: CRNA  Anesthesia Plan Comments:         Anesthesia Quick Evaluation

## 2019-09-29 NOTE — Transfer of Care (Signed)
Immediate Anesthesia Transfer of Care Note  Patient: Diamond Jackson  Procedure(s) Performed: COLONOSCOPY WITH BIOPSY (N/A Rectum) POLYPECTOMY (N/A Rectum)  Patient Location: PACU  Anesthesia Type: General  Level of Consciousness: awake, alert  and patient cooperative  Airway and Oxygen Therapy: Patient Spontanous Breathing and Patient connected to supplemental oxygen  Post-op Assessment: Post-op Vital signs reviewed, Patient's Cardiovascular Status Stable, Respiratory Function Stable, Patent Airway and No signs of Nausea or vomiting  Post-op Vital Signs: Reviewed and stable  Complications: No apparent anesthesia complications

## 2019-09-29 NOTE — Anesthesia Postprocedure Evaluation (Signed)
Anesthesia Post Note  Patient: Diamond Jackson  Procedure(s) Performed: COLONOSCOPY WITH BIOPSY (N/A Rectum) POLYPECTOMY (N/A Rectum)     Patient location during evaluation: PACU Anesthesia Type: General Level of consciousness: awake and alert Pain management: pain level controlled Vital Signs Assessment: post-procedure vital signs reviewed and stable Respiratory status: spontaneous breathing, nonlabored ventilation, respiratory function stable and patient connected to nasal cannula oxygen Cardiovascular status: blood pressure returned to baseline and stable Postop Assessment: no apparent nausea or vomiting Anesthetic complications: no    Adele Barthel Tigerlily Christine

## 2019-09-29 NOTE — Op Note (Signed)
[facility]astroenterology Patient Name: Diamond Jackson Procedure Date: 09/29/2019 8:40 AM MRN: [id number] Account #: [id number] Date of Birth: 1956-05-17 Admit Type: Outpatient Age: 64 Room: [facility] OR ROOM 01 Gender: Female Note Status: Finalized Procedure:             Colonoscopy Indications:           High risk colon cancer surveillance: Personal history                         of colonic polyps Providers:             [name] MD, MD Referring MD:          Halina Maidens, MD (Referring MD) Medicines:             Propofol per Anesthesia Complications:         No immediate complications. Procedure:             Pre-Anesthesia Assessment:                        - Prior to the procedure, a History and Physical was                         performed, and patient medications and allergies were                         reviewed. The patient's tolerance of previous                         anesthesia was also reviewed. The risks and benefits                         of the procedure and the sedation options and risks                         were discussed with the patient. All questions were                         answered, and informed consent was obtained. Prior                         Anticoagulants: The patient has taken no previous                         anticoagulant or antiplatelet agents. ASA Grade                         Assessment: II - A patient with mild systemic disease.                         After reviewing the risks and benefits, the patient                         was deemed in satisfactory condition to undergo the                         procedure.  After obtaining informed consent, the colonoscope was                         passed under direct vision. Throughout the procedure,                         the patient's blood pressure, pulse, and oxygen                         saturations were monitored continuously. The             Colonoscope was introduced through the anus and                         advanced to the the cecum, identified by appendiceal                         orifice and ileocecal valve. The colonoscopy was                         performed without difficulty. The patient tolerated                         the procedure well. The quality of the bowel                         preparation was excellent. Findings:      The perianal and digital rectal examinations were normal.      Two sessile polyps were found in the sigmoid colon. The polyps were 3 to       4 mm in size. These polyps were removed with a cold snare. Resection and       retrieval were complete.      A tattoo was seen at the hepatic flexure.      A 9 mm polyp was found in the hepatic flexure. The polyp was sessile.       The polyp was removed with a hot snare. Resection and retrieval were       complete. To prevent bleeding post-intervention, two hemostatic clips       were successfully placed (MR conditional). There was no bleeding at the       end of the procedure.      Multiple small-mouthed diverticula were found in the sigmoid colon.      Non-bleeding internal hemorrhoids were found during retroflexion. The       hemorrhoids were Grade I (internal hemorrhoids that do not prolapse). Impression:            - Two 3 to 4 mm polyps in the sigmoid colon, removed                         with a cold snare. Resected and retrieved.                        - A tattoo was seen at the hepatic flexure.                        - One 9 mm polyp at the hepatic flexure, removed with  a hot snare. Resected and retrieved. Clips (MR                         conditional) were placed.                        - Diverticulosis in the sigmoid colon.                        - Non-bleeding internal hemorrhoids. Recommendation:        - Discharge patient to home.                        - Resume previous diet.                         - Continue present medications.                        - Repeat colonoscopy in 1 year for surveillance. Procedure Code(s):     --- Professional ---                        [phone], Colonoscopy, flexible; with removal of                         tumor(s), polyp(s), or other lesion(s) by snare                         technique Diagnosis Code(s):     --- Professional ---                        Z86.010, Personal history of colonic polyps                        K63.5, Polyp of colon CPT copyright 2019 American Medical Association. All rights reserved. The codes documented in this report are preliminary and upon coder review may  be revised to meet current compliance requirements. [name] MD, MD 09/29/2019 9:17:53 AM This report has been signed electronically. Number of Addenda: 0 Note Initiated On: 09/29/2019 8:40 AM Scope Withdrawal Time: 0 hours 20 minutes 45 seconds  Total Procedure Duration: 0 hours 23 minutes 55 seconds  Estimated Blood Loss:  Estimated blood loss: none.      [facility]

## 2019-09-30 ENCOUNTER — Encounter: Payer: Self-pay | Admitting: *Deleted

## 2019-09-30 LAB — SURGICAL PATHOLOGY

## 2019-10-01 ENCOUNTER — Encounter: Payer: Self-pay | Admitting: Gastroenterology

## 2019-10-14 ENCOUNTER — Ambulatory Visit: Payer: 59 | Attending: Internal Medicine

## 2019-10-14 DIAGNOSIS — Z23 Encounter for immunization: Secondary | ICD-10-CM

## 2019-10-14 NOTE — Progress Notes (Signed)
   Covid-19 Vaccination Clinic  Name:  Diamond Jackson    MRN: [id number] DOB: [birth year]  10/14/2019  Ms. [name] was observed post Covid-19 immunization for 15 minutes without incident. She was provided with Vaccine Information Sheet and instruction to access the V-Safe system.   Ms. [name] was instructed to call 911 with any severe reactions post vaccine: [name] Kitchen Difficulty breathing  . Swelling of face and throat  . A fast heartbeat  . A bad rash all over body  . Dizziness and weakness   Immunizations Administered    Name Date Dose VIS Date Route   Pfizer COVID-19 Vaccine 10/14/2019  8:37 AM 0.3 mL 08/06/2018 Intramuscular   Manufacturer: [place]amin   Lot: [id number]   [place]: [id number]

## 2019-12-11 DIAGNOSIS — H269 Unspecified cataract: Secondary | ICD-10-CM

## 2019-12-11 HISTORY — DX: Unspecified cataract: H26.9

## 2020-01-23 ENCOUNTER — Telehealth: Payer: Self-pay | Admitting: Internal Medicine

## 2020-01-23 ENCOUNTER — Other Ambulatory Visit: Payer: Self-pay | Admitting: Internal Medicine

## 2020-01-23 DIAGNOSIS — Z720 Tobacco use: Secondary | ICD-10-CM

## 2020-01-23 MED ORDER — ALBUTEROL SULFATE HFA 108 (90 BASE) MCG/ACT IN AERS: 1.0000 | INHALATION_SPRAY | Freq: Four times a day (QID) | RESPIRATORY_TRACT | 5 refills | Status: DC | PRN

## 2020-01-23 NOTE — Telephone Encounter (Signed)
Pt is unable to fax the requirement and would like to know if she could have someone email address to sent info to

## 2020-01-23 NOTE — Telephone Encounter (Signed)
Spoke to pt she stated that she was able to fax the paperwork. Pt said that she had no preference of which on she wanted. As of 01/23/2020 4:22PM I have not received the fax yet.  KP

## 2020-01-23 NOTE — Telephone Encounter (Signed)
Patient is calling to let Dr. Army Melia know that albuterol [facility] HFA) 108 (90 Base) MCG/ACT inhaler [[id number]] sent to [facility] is no longer covered by the patient's insurance.   Insurance is requesting Albuterol Suffulate HFA (By cipla, lupin, par, perrigo, Perficent RX, Sandiz and Teva)  They no longer pay for pro air  Patient will fax requirement  Preferred [place]  Please advise CB- [phone]

## 2020-04-20 ENCOUNTER — Other Ambulatory Visit (HOSPITAL_COMMUNITY)
Admission: RE | Admit: 2020-04-20 | Discharge: 2020-04-20 | Disposition: A | Discharge status: Home / Self Care | Payer: 59 | Source: Ambulatory Visit | Attending: Internal Medicine | Admitting: Internal Medicine

## 2020-04-20 ENCOUNTER — Ambulatory Visit (INDEPENDENT_AMBULATORY_CARE_PROVIDER_SITE_OTHER): Payer: 59 | Admitting: Internal Medicine

## 2020-04-20 ENCOUNTER — Encounter: Payer: Self-pay | Admitting: Internal Medicine

## 2020-04-20 ENCOUNTER — Other Ambulatory Visit: Payer: Self-pay

## 2020-04-20 ENCOUNTER — Telehealth: Payer: Self-pay

## 2020-04-20 VITALS — BP 106/68 | HR 85 | Temp 98.6°F | Ht 65.0 in | Wt 206.0 lb

## 2020-04-20 DIAGNOSIS — Z1231 Encounter for screening mammogram for malignant neoplasm of breast: Secondary | ICD-10-CM | POA: Diagnosis not present

## 2020-04-20 DIAGNOSIS — Z124 Encounter for screening for malignant neoplasm of cervix: Secondary | ICD-10-CM | POA: Insufficient documentation

## 2020-04-20 DIAGNOSIS — Z23 Encounter for immunization: Secondary | ICD-10-CM | POA: Diagnosis not present

## 2020-04-20 DIAGNOSIS — Z0000 Encounter for general adult medical examination without abnormal findings: Secondary | ICD-10-CM | POA: Diagnosis not present

## 2020-04-20 DIAGNOSIS — Z716 Tobacco abuse counseling: Secondary | ICD-10-CM

## 2020-04-20 DIAGNOSIS — R7303 Prediabetes: Secondary | ICD-10-CM

## 2020-04-20 DIAGNOSIS — Z8711 Personal history of peptic ulcer disease: Secondary | ICD-10-CM | POA: Diagnosis not present

## 2020-04-20 DIAGNOSIS — Z720 Tobacco use: Secondary | ICD-10-CM

## 2020-04-20 LAB — POCT URINALYSIS DIPSTICK
Bilirubin, UA: NEGATIVE
Glucose, UA: NEGATIVE
Ketones, UA: NEGATIVE
Leukocytes, UA: NEGATIVE
Nitrite, UA: NEGATIVE
Protein, UA: NEGATIVE
Spec Grav, UA: 1.015 (ref 1.010–1.025)
Urobilinogen, UA: 0.2 E.U./dL
pH, UA: 6 (ref 5.0–8.0)

## 2020-04-20 NOTE — Progress Notes (Signed)
Date:  04/20/2020   Name:  Diamond Jackson   DOB:  January 16, 1956   MRN:  [id number]   Chief Complaint: Annual Exam (breast exam with pap)  Diamond Jackson is a 64 y.o. female who presents today for her Complete Annual Exam. She feels fairly well. She reports exercising none. She reports she is sleeping fairly well. Breast complaints none.  She is still smoking about 1/2 ppd since age 21.  She is interested in LDCT screening for lung cancer.  Mammogram: 06/2019 DEXA: none Pap smear: 03/2015 neg with cotesting Colonoscopy: 09/2019 repeat 1 yr due to large polyp  Immunization History  Administered Date(s) Administered  . Influenza,inj,Quad PF,6+ Mos 04/15/2018, 04/17/2019  . Influenza-Unspecified 03/13/2015, 03/16/2016, 03/30/2017  . PFIZER SARS-COV-2 Vaccination 09/18/2019, 10/14/2019  . Tdap 04/17/2013  . Zoster Recombinat (Shingrix) 05/29/2019, 08/27/2019    Diabetes She presents for her follow-up diabetic visit. Diabetes type: prediabetes. Pertinent negatives for hypoglycemia include no dizziness, headaches, nervousness/anxiousness or tremors. There are no diabetic associated symptoms. Pertinent negatives for diabetes include no chest pain, no fatigue, no polydipsia and no polyuria. Current diabetic treatment includes diet. Her weight is stable. She is following a generally healthy diet.  Gastroesophageal Reflux She reports no abdominal pain, no chest pain, no choking, no coughing, no dysphagia, no heartburn or no wheezing. Pertinent negatives include no fatigue. She has tried a histamine-2 antagonist for the symptoms. The treatment provided significant relief.    Lab Results  Component Value Date   CREATININE 0.96 04/17/2019   BUN 15 04/17/2019   NA 140 04/17/2019   K 4.4 04/17/2019   CL 105 04/17/2019   CO2 20 04/17/2019   Lab Results  Component Value Date   CHOL 177 04/17/2019   HDL 44 04/17/2019   LDLCALC 107 (H) 04/17/2019   TRIG 146 04/17/2019   CHOLHDL 4.0 04/17/2019    Lab Results  Component Value Date   TSH 0.743 04/17/2019   Lab Results  Component Value Date   HGBA1C 6.3 (H) 04/17/2019   Lab Results  Component Value Date   WBC 9.2 04/17/2019   HGB 15.5 04/17/2019   HCT 44.8 04/17/2019   MCV 87 04/17/2019   PLT 231 04/17/2019   Lab Results  Component Value Date   ALT 15 04/17/2019   AST 15 04/17/2019   ALKPHOS 85 04/17/2019   BILITOT 0.3 04/17/2019     Review of Systems  Constitutional: Positive for unexpected weight change. Negative for chills, fatigue and fever.  HENT: Negative for congestion, hearing loss, tinnitus, trouble swallowing and voice change.   Eyes: Negative for visual disturbance.  Respiratory: Negative for cough, choking, chest tightness, shortness of breath and wheezing.   Cardiovascular: Negative for chest pain, palpitations and leg swelling.  Gastrointestinal: Negative for abdominal pain, constipation, diarrhea, dysphagia, heartburn and vomiting.  Endocrine: Negative for polydipsia and polyuria.  Genitourinary: Negative for dysuria, frequency, genital sores, vaginal bleeding and vaginal discharge.  Musculoskeletal: Negative for arthralgias, gait problem and joint swelling.  Skin: Negative for color change and rash.  Allergic/Immunologic: Positive for environmental allergies.  Neurological: Negative for dizziness, tremors, light-headedness and headaches.  Hematological: Negative for adenopathy. Does not bruise/bleed easily.  Psychiatric/Behavioral: Negative for dysphoric mood and sleep disturbance. The patient is not nervous/anxious.     Patient Active Problem List   Diagnosis Date Noted  . Personal history of colonic polyps   . Polyp of transverse colon   . Benign neoplasm of cecum   .  Benign neoplasm of transverse colon   . Benign neoplasm of rectosigmoid junction   . Allergic rhinitis, seasonal 03/22/2015  . Pre-diabetes 03/22/2015  . Fibrocystic breast changes 03/22/2015  . Hx of peptic ulcer 03/22/2015   . Current tobacco use 03/22/2015    No Known Allergies  Past Surgical History:  Procedure Laterality Date  . ANKLE SURGERY Right 1984  . BREAST BIOPSY Left    benign two areas/clips  . CHOLECYSTECTOMY  2010  . CLEFT PALATE REPAIR     as an infant  . COLONOSCOPY  2010   normal  . COLONOSCOPY WITH PROPOFOL N/A 05/17/2018   Procedure: COLONOSCOPY WITH BIOPSIES;  Surgeon: [name], MD;  Location: [place];  Service: Endoscopy;  Laterality: N/A;  . COLONOSCOPY WITH PROPOFOL N/A 09/29/2019   Procedure: COLONOSCOPY WITH BIOPSY;  Surgeon: [name], MD;  Location: [place];  Service: Endoscopy;  Laterality: N/A;  priority 3  . POLYPECTOMY  05/17/2018   Procedure: POLYPECTOMY;  Surgeon: [name], MD;  Location: [place];  Service: Endoscopy;;  . POLYPECTOMY N/A 09/29/2019   Procedure: POLYPECTOMY;  Surgeon: [name], MD;  Location: [place];  Service: Endoscopy;  Laterality: N/A;  Clips x 2 placed at site of Hepatic Flexure Polyp site removal    Social History   Tobacco Use  . Smoking status: Current Every Day Smoker    Packs/day: 0.33    Years: 55.00    Pack years: 18.15    Types: Cigarettes  . Smokeless tobacco: Never Used  . Tobacco comment: since age 25- 46 cigs daily  Vaping Use  . Vaping Use: Never used  Substance Use Topics  . Alcohol use: No    Alcohol/week: 0.0 standard drinks  . Drug use: No     Medication list has been reviewed and updated.  Current Meds  Medication Sig  . albuterol (PROAIR HFA) 108 (90 Base) MCG/ACT inhaler Inhale 1-2 puffs into the lungs every 6 (six) hours as needed for wheezing or shortness of breath.  . cetirizine (ZYRTEC) 10 MG tablet Take 10 mg by mouth daily as needed for allergies.  . famotidine (PEPCID) 20 MG tablet Take 20 mg by mouth 2 (two) times daily.  . OPCON-A 0.027-0.315 % SOLN     PHQ 2/9 Scores 04/20/2020 04/17/2019 04/15/2018 04/11/2017  PHQ - 2 Score 0 0 0 1  PHQ- 9  Score 0 0 - -    GAD 7 : Generalized Anxiety Score 04/20/2020  Nervous, Anxious, on Edge 0  Control/stop worrying 0  Worry too much - different things 0  Trouble relaxing 0  Restless 0  Easily annoyed or irritable 0  Afraid - awful might happen 0  Total GAD 7 Score 0    BP Readings from Last 3 Encounters:  04/20/20 106/68  09/29/19 (!) 89/62  04/29/19 109/67    Physical Exam Vitals and nursing note reviewed.  Constitutional:      General: She is not in acute distress.    Appearance: She is well-developed.  HENT:     Head: Normocephalic and atraumatic.     Right Ear: Tympanic membrane and ear canal normal.     Left Ear: Tympanic membrane and ear canal normal.     Nose:     Right Sinus: No maxillary sinus tenderness.     Left Sinus: No maxillary sinus tenderness.  Eyes:     General: No scleral icterus.       Right eye: No  discharge.        Left eye: No discharge.     Conjunctiva/sclera: Conjunctivae normal.  Neck:     Thyroid: No thyromegaly.     Vascular: No carotid bruit.  Cardiovascular:     Rate and Rhythm: Normal rate and regular rhythm.     Pulses: Normal pulses.     Heart sounds: Normal heart sounds.  Pulmonary:     Effort: Pulmonary effort is normal. No respiratory distress.     Breath sounds: No wheezing.  Chest:     Breasts:        Right: No mass, nipple discharge, skin change or tenderness.        Left: No mass, nipple discharge, skin change or tenderness.  Abdominal:     General: Bowel sounds are normal.     Palpations: Abdomen is soft.     Tenderness: There is no abdominal tenderness.  Genitourinary:    Labia:        Right: No tenderness, lesion or injury.        Left: No tenderness, lesion or injury.      Vagina: Normal.     Cervix: Normal.     Uterus: Normal.      Adnexa: Right adnexa normal and left adnexa normal.  Musculoskeletal:     Cervical back: Normal range of motion. No erythema.     Right lower leg: No edema.     Left lower leg:  No edema.  Lymphadenopathy:     Cervical: No cervical adenopathy.  Skin:    General: Skin is warm and dry.     Capillary Refill: Capillary refill takes less than 2 seconds.     Findings: No rash.  Neurological:     Mental Status: She is alert and oriented to person, place, and time.     Cranial Nerves: No cranial nerve deficit.     Sensory: No sensory deficit.     Deep Tendon Reflexes: Reflexes are normal and symmetric.  Psychiatric:        Attention and Perception: Attention normal.        Mood and Affect: Mood normal.     Wt Readings from Last 3 Encounters:  04/20/20 206 lb (93.4 kg)  09/29/19 194 lb (88 kg)  04/29/19 204 lb 11.2 oz (92.9 kg)    BP 106/68   Pulse 85   Temp 98.6 F (37 C) (Oral)   Ht 5\' 5"  (1.651 m)   Wt 206 lb (93.4 kg)   SpO2 95%   BMI 34.28 kg/m   Assessment and Plan: 1. Annual physical exam Exam is normal except for weight. Encourage regular exercise and appropriate dietary changes. - Lipid panel - POCT urinalysis dipstick  2. Encounter for screening mammogram for breast cancer Due in January at [place]; Future  3. Encounter for screening for cervical cancer Pap obtained today - with HPV No further screenings will be needed if Pap and HPV are negative - Cytology - PAP  4. Hx of peptic ulcer - CBC with Differential/Platelet  5. Pre-diabetes Continue dietary efforts, try to add regular exercise - Comprehensive metabolic panel - Hemoglobin A1c - TSH  6. Current tobacco use Pt has tried and failed Chantix She has smoked for 40+ years Will refer for LDCT screening  7. Encounter for smoking cessation counseling As above   Partially dictated using Editor, commissioning. Any errors are unintentional.  Halina Maidens, MD [facility]  04/20/2020

## 2020-04-20 NOTE — Telephone Encounter (Signed)
Contacted patient today for lung CT screening clinic based on referral from Dr. Army Melia. I spoke to patient and she states she is interested in enrolling, but is trying to meet a deadline at work today and asked [place] to call her back in a few days.

## 2020-04-21 LAB — COMPREHENSIVE METABOLIC PANEL
ALT: 14 IU/L (ref 0–32)
AST: 13 IU/L (ref 0–40)
Albumin/Globulin Ratio: 2.2 (ref 1.2–2.2)
Albumin: 4.3 g/dL (ref 3.8–4.8)
Alkaline Phosphatase: 80 IU/L (ref 44–121)
BUN/Creatinine Ratio: 11 — ABNORMAL LOW (ref 12–28)
BUN: 10 mg/dL (ref 8–27)
Bilirubin Total: 0.3 mg/dL (ref 0.0–1.2)
CO2: 20 mmol/L (ref 20–29)
Calcium: 9.2 mg/dL (ref 8.7–10.3)
Chloride: 104 mmol/L (ref 96–106)
Creatinine, Ser: 0.9 mg/dL (ref 0.57–1.00)
GFR calc Af Amer: 79 mL/min/{1.73_m2} (ref >59)
GFR calc non Af Amer: 68 mL/min/{1.73_m2} (ref >59)
Globulin, Total: 2 g/dL (ref 1.5–4.5)
Glucose: 101 mg/dL — ABNORMAL HIGH (ref 65–99)
Potassium: 4.3 mmol/L (ref 3.5–5.2)
Sodium: 140 mmol/L (ref 134–144)
Total Protein: 6.3 g/dL (ref 6.0–8.5)

## 2020-04-21 LAB — CBC WITH DIFFERENTIAL/PLATELET
Basophils Absolute: 0.1 10*3/uL (ref 0.0–0.2)
Basos: 1 %
EOS (ABSOLUTE): 0.9 10*3/uL — ABNORMAL HIGH (ref 0.0–0.4)
Eos: 9 %
Hematocrit: 43.9 % (ref 34.0–46.6)
Hemoglobin: 14.4 g/dL (ref 11.1–15.9)
Immature Grans (Abs): 0.1 10*3/uL (ref 0.0–0.1)
Immature Granulocytes: 1 %
Lymphocytes Absolute: 2.5 10*3/uL (ref 0.7–3.1)
Lymphs: 24 %
MCH: 28.8 pg (ref 26.6–33.0)
MCHC: 32.8 g/dL (ref 31.5–35.7)
MCV: 88 fL (ref 79–97)
Monocytes Absolute: 0.6 10*3/uL (ref 0.1–0.9)
Monocytes: 6 %
Neutrophils Absolute: 6.3 10*3/uL (ref 1.4–7.0)
Neutrophils: 59 %
Platelets: 237 10*3/uL (ref 150–450)
RBC: 5 x10E6/uL (ref 3.77–5.28)
RDW: 13.3 % (ref 11.7–15.4)
WBC: 10.4 10*3/uL (ref 3.4–10.8)

## 2020-04-21 LAB — CYTOLOGY - PAP
Adequacy: ABSENT
Comment: NEGATIVE
Diagnosis: NEGATIVE
High risk HPV: NEGATIVE

## 2020-04-21 LAB — HEMOGLOBIN A1C
Est. average glucose Bld gHb Est-mCnc: 143 mg/dL
Hgb A1c MFr Bld: 6.6 % — ABNORMAL HIGH (ref 4.8–5.6)

## 2020-04-21 LAB — LIPID PANEL
Chol/HDL Ratio: 3.8 ratio (ref 0.0–4.4)
Cholesterol, Total: 152 mg/dL (ref 100–199)
HDL: 40 mg/dL (ref >39)
LDL Chol Calc (NIH): 93 mg/dL (ref 0–99)
Triglycerides: 101 mg/dL (ref 0–149)
VLDL Cholesterol Cal: 19 mg/dL (ref 5–40)

## 2020-04-21 LAB — TSH: TSH: 0.888 u[IU]/mL (ref 0.450–4.500)

## 2020-04-26 ENCOUNTER — Encounter: Payer: Self-pay | Admitting: *Deleted

## 2020-04-26 DIAGNOSIS — Z87891 Personal history of nicotine dependence: Secondary | ICD-10-CM

## 2020-04-26 DIAGNOSIS — Z122 Encounter for screening for malignant neoplasm of respiratory organs: Secondary | ICD-10-CM

## 2020-04-26 NOTE — Addendum Note (Signed)
Addended by: Lieutenant Diego on: 04/26/2020 11:12 AM   Modules accepted: Orders

## 2020-04-26 NOTE — Telephone Encounter (Signed)
Received referral for low dose lung cancer screening CT scan. Message left at phone number listed in EMR for patient to call me back to facilitate scheduling scan.  

## 2020-04-26 NOTE — Telephone Encounter (Signed)
Received referral for initial lung cancer screening scan. Contacted patient and obtained smoking history,(current, 72 pack year) as well as answering questions related to screening process. Patient denies signs of lung cancer such as weight loss or hemoptysis. Patient denies comorbidity that would prevent curative treatment if lung cancer were found. Patient is scheduled for shared decision making visit and CT scan on 05/25/20 at 2pm.

## 2020-05-25 ENCOUNTER — Ambulatory Visit
Admission: RE | Admit: 2020-05-25 | Discharge: 2020-05-25 | Disposition: A | Discharge status: Home / Self Care | Payer: 59 | Source: Ambulatory Visit | Attending: Nurse Practitioner | Admitting: Nurse Practitioner

## 2020-05-25 ENCOUNTER — Other Ambulatory Visit: Payer: Self-pay

## 2020-05-25 ENCOUNTER — Inpatient Hospital Stay: Payer: 59 | Attending: Nurse Practitioner | Admitting: Nurse Practitioner

## 2020-05-25 DIAGNOSIS — Z122 Encounter for screening for malignant neoplasm of respiratory organs: Secondary | ICD-10-CM | POA: Insufficient documentation

## 2020-05-25 DIAGNOSIS — Z87891 Personal history of nicotine dependence: Secondary | ICD-10-CM | POA: Insufficient documentation

## 2020-05-26 NOTE — Progress Notes (Signed)
Virtual Visit via Video Enabled Telemedicine Note   I connected with Brenae R Marquis on 05/26/20 at 2:00 PM EST by video enabled telemedicine visit and verified that I am speaking with the correct person using two identifiers.   I discussed the limitations, risks, security and privacy concerns of performing an evaluation and management service by telemedicine and the availability of in-person appointments. I also discussed with the patient that there may be a patient responsible charge related to this service. The patient expressed understanding and agreed to proceed.   Other persons participating in the visit and their role in the encounter: Burgess Estelle, RN- checking in patient & navigation  Patient's location: [place]  Provider's location: Clinic  Chief Complaint: Low Dose CT Screening  Patient agreed to evaluation by telemedicine to discuss shared decision making for consideration of low dose CT lung cancer screening.    In accordance with CMS guidelines, patient has met eligibility criteria including age, absence of signs or symptoms of lung cancer.  Social History   Tobacco Use  . Smoking status: Current Every Day Smoker    Packs/day: 1.50    Years: 48.00    Pack years: 72.00    Types: Cigarettes  . Smokeless tobacco: Never Used  . Tobacco comment: 0.75ppd currently  Substance Use Topics  . Alcohol use: No    Alcohol/week: 0.0 standard drinks     A shared decision-making session was conducted prior to the performance of CT scan. This includes one or more decision aids, includes benefits and harms of screening, follow-up diagnostic testing, over-diagnosis, false positive rate, and total radiation exposure.   Counseling on the importance of adherence to annual lung cancer LDCT screening, impact of co-morbidities, and ability or willingness to undergo diagnosis and treatment is imperative for compliance of the program.   Counseling on the importance of continued smoking  cessation for former smokers; the importance of smoking cessation for current smokers, and information about tobacco cessation interventions have been given to patient including [place] and [facility] [employer] [place] programs.   Written order for lung cancer screening with LDCT has been given to the patient and any and all questions have been answered to the best of my abilities.    Yearly follow up will be coordinated by Burgess Estelle, Thoracic Navigator.  I discussed the assessment and treatment plan with the patient. The patient was provided an opportunity to ask questions and all were answered. The patient agreed with the plan and demonstrated an understanding of the instructions.   The patient was advised to call back or seek an in-person evaluation if the symptoms worsen or if the condition fails to improve as anticipated.   I provided 15 minutes of face-to-face video visit time during this encounter, and > 50% was spent counseling as documented under my assessment & plan.   Beckey Rutter, DNP, AGNP-C [place] at [facility] [phone] (clinic)

## 2020-05-27 ENCOUNTER — Telehealth: Payer: Self-pay | Admitting: *Deleted

## 2020-05-27 NOTE — Telephone Encounter (Signed)
Notified patient of LDCT lung cancer screening program results with recommendation for 6 month follow up imaging. Also notified of incidental findings noted below and is encouraged to discuss further with PCP who will receive a copy of this note and/or the CT report. Patient verbalizes understanding. Patient reports she is having a productive cough and will be contacting her PCP about this.   IMPRESSION: Lung-RADS 3, probably benign findings. Short-term follow-up in 6 months is recommended with repeat low-dose chest CT without contrast (please use the following order, "CT CHEST LCS NODULE FOLLOW-UP W/O CM").  Irregular nodule in the right lung apex measuring up to 7.5 mm, possibly related to pleural-parenchymal scarring or mild infection/inflammation, but indeterminate.  Aortic Atherosclerosis (ICD10-I70.0) and Emphysema (ICD10

## 2020-05-28 NOTE — Telephone Encounter (Signed)
Please call pt to schedule appt for cough.  KP

## 2020-05-28 NOTE — Telephone Encounter (Signed)
Called and left a message.

## 2020-05-28 NOTE — Telephone Encounter (Signed)
Thanks Shawn,  I will have my CMA contact her to schedule an appointment.

## 2020-06-01 ENCOUNTER — Other Ambulatory Visit: Payer: Self-pay

## 2020-06-01 ENCOUNTER — Encounter: Payer: Self-pay | Admitting: Internal Medicine

## 2020-06-01 ENCOUNTER — Ambulatory Visit (INDEPENDENT_AMBULATORY_CARE_PROVIDER_SITE_OTHER): Payer: 59 | Admitting: Internal Medicine

## 2020-06-01 VITALS — BP 116/74 | HR 98 | Temp 98.3°F | Ht 65.0 in | Wt 205.0 lb

## 2020-06-01 DIAGNOSIS — J0100 Acute maxillary sinusitis, unspecified: Secondary | ICD-10-CM

## 2020-06-01 DIAGNOSIS — J432 Centrilobular emphysema: Secondary | ICD-10-CM | POA: Diagnosis not present

## 2020-06-01 DIAGNOSIS — I700 Atherosclerosis of aorta: Secondary | ICD-10-CM | POA: Insufficient documentation

## 2020-06-01 MED ORDER — MONTELUKAST SODIUM 10 MG PO TABS: 10.0000 mg | ORAL_TABLET | Freq: Every day | ORAL | 3 refills | Status: DC

## 2020-06-01 MED ORDER — AMOXICILLIN-POT CLAVULANATE 875-125 MG PO TABS: 1.0000 | ORAL_TABLET | Freq: Two times a day (BID) | ORAL | 0 refills | Status: AC

## 2020-06-01 MED ORDER — ATORVASTATIN CALCIUM 10 MG PO TABS: 10.0000 mg | ORAL_TABLET | Freq: Every day | ORAL | 3 refills | Status: DC

## 2020-06-01 NOTE — Progress Notes (Signed)
Date:  06/01/2020   Name:  Diamond Jackson   DOB:  January 24, 1956   MRN:  [id number]   Chief Complaint: Cough (X1 month has gotten worse the past week, no fever, post nasal drip, pressure behind eyes, thick yellow mucous when coughing, eyes itch )  Cough This is a new problem. The cough is productive of sputum. Associated symptoms include nasal congestion, shortness of breath and wheezing. Pertinent negatives include no fever or headaches. Risk factors for lung disease include smoking/tobacco exposure.   Immunization History  Administered Date(s) Administered  . Influenza,inj,Quad PF,6+ Mos 04/15/2018, 04/17/2019, 04/20/2020  . Influenza-Unspecified 03/13/2015, 03/16/2016, 03/30/2017  . PFIZER SARS-COV-2 Vaccination 09/18/2019, 10/14/2019, 05/27/2020  . Tdap 04/17/2013  . Zoster Recombinat (Shingrix) 05/29/2019, 08/27/2019    Lab Results  Component Value Date   CREATININE 0.90 04/20/2020   BUN 10 04/20/2020   NA 140 04/20/2020   K 4.3 04/20/2020   CL 104 04/20/2020   CO2 20 04/20/2020   Lab Results  Component Value Date   CHOL 152 04/20/2020   HDL 40 04/20/2020   LDLCALC 93 04/20/2020   TRIG 101 04/20/2020   CHOLHDL 3.8 04/20/2020   Lab Results  Component Value Date   TSH 0.888 04/20/2020   Lab Results  Component Value Date   HGBA1C 6.6 (H) 04/20/2020   Lab Results  Component Value Date   WBC 10.4 04/20/2020   HGB 14.4 04/20/2020   HCT 43.9 04/20/2020   MCV 88 04/20/2020   PLT 237 04/20/2020   Lab Results  Component Value Date   ALT 14 04/20/2020   AST 13 04/20/2020   ALKPHOS 80 04/20/2020   BILITOT 0.3 04/20/2020     Review of Systems  Constitutional: Negative for fever.  Respiratory: Positive for cough, shortness of breath and wheezing.   Neurological: Negative for headaches.    Patient Active Problem List   Diagnosis Date Noted  . Centrilobular emphysema ([place]) 06/01/2020  . Aortic atherosclerosis ([place]) 06/01/2020  . Personal history of colonic  polyps   . Polyp of transverse colon   . Benign neoplasm of cecum   . Benign neoplasm of transverse colon   . Benign neoplasm of rectosigmoid junction   . Allergic rhinitis, seasonal 03/22/2015  . Pre-diabetes 03/22/2015  . Fibrocystic breast changes 03/22/2015  . Hx of peptic ulcer 03/22/2015  . Current tobacco use 03/22/2015    No Known Allergies  Past Surgical History:  Procedure Laterality Date  . ANKLE SURGERY Right 1984  . BREAST BIOPSY Left    benign two areas/clips  . CHOLECYSTECTOMY  2010  . CLEFT PALATE REPAIR     as an infant  . COLONOSCOPY  2010   normal  . COLONOSCOPY WITH PROPOFOL N/A 05/17/2018   Procedure: COLONOSCOPY WITH BIOPSIES;  Surgeon: [name], MD;  Location: [facility];  Service: Endoscopy;  Laterality: N/A;  . COLONOSCOPY WITH PROPOFOL N/A 09/29/2019   Procedure: COLONOSCOPY WITH BIOPSY;  Surgeon: [name], MD;  Location: [facility];  Service: Endoscopy;  Laterality: N/A;  priority 3  . POLYPECTOMY  05/17/2018   Procedure: POLYPECTOMY;  Surgeon: [name], MD;  Location: [facility];  Service: Endoscopy;;  . POLYPECTOMY N/A 09/29/2019   Procedure: POLYPECTOMY;  Surgeon: [name], MD;  Location: [facility];  Service: Endoscopy;  Laterality: N/A;  Clips x 2 placed at site of Hepatic Flexure Polyp site removal    Social History   Tobacco Use  . Smoking status:  Current Every Day Smoker    Packs/day: 1.50    Years: 48.00    Pack years: 72.00    Types: Cigarettes  . Smokeless tobacco: Never Used  . Tobacco comment: 0.75ppd currently  Vaping Use  . Vaping Use: Never used  Substance Use Topics  . Alcohol use: No    Alcohol/week: 0.0 standard drinks  . Drug use: No     Medication list has been reviewed and updated.  Current Meds  Medication Sig  . albuterol (PROAIR HFA) 108 (90 Base) MCG/ACT inhaler Inhale 1-2 puffs into the lungs every 6 (six) hours as needed for wheezing or shortness of breath.   . cetirizine (ZYRTEC) 10 MG tablet Take 10 mg by mouth daily as needed for allergies.  . famotidine (PEPCID) 20 MG tablet Take 20 mg by mouth 2 (two) times daily.  . Levocetirizine Dihydrochloride (XYZAL PO) Take by mouth as needed.  . OPCON-A 0.027-0.315 % SOLN     PHQ 2/9 Scores 06/01/2020 04/20/2020 04/17/2019 04/15/2018  PHQ - 2 Score 0 0 0 0  PHQ- 9 Score 0 0 0 -    GAD 7 : Generalized Anxiety Score 06/01/2020 04/20/2020  Nervous, Anxious, on Edge 0 0  Control/stop worrying 0 0  Worry too much - different things 0 0  Trouble relaxing 0 0  Restless 0 0  Easily annoyed or irritable 0 0  Afraid - awful might happen 0 0  Total GAD 7 Score 0 0    BP Readings from Last 3 Encounters:  06/01/20 116/74  04/20/20 106/68  09/29/19 (!) 89/62    Physical Exam Constitutional:      Appearance: She is well-developed and well-nourished.  HENT:     Right Ear: Ear canal and external ear normal. Tympanic membrane is not erythematous or retracted.     Left Ear: Ear canal and external ear normal. Tympanic membrane is not erythematous or retracted.     Nose:     Right Sinus: Maxillary sinus tenderness and frontal sinus tenderness present.     Left Sinus: Maxillary sinus tenderness and frontal sinus tenderness present.     Mouth/Throat:     Mouth: Mucous membranes are normal. No oral lesions.     Pharynx: Uvula midline. Posterior oropharyngeal erythema present. No oropharyngeal exudate.  Cardiovascular:     Rate and Rhythm: Normal rate and regular rhythm.     Pulses: Normal pulses.     Heart sounds: Normal heart sounds.  Pulmonary:     Breath sounds: Normal breath sounds. No wheezing or rales.  Lymphadenopathy:     Cervical: No cervical adenopathy.  Skin:    Capillary Refill: Capillary refill takes less than 2 seconds.  Neurological:     General: No focal deficit present.     Mental Status: She is alert and oriented to person, place, and time.  Psychiatric:        Mood and Affect:  Mood normal.     Wt Readings from Last 3 Encounters:  06/01/20 205 lb (93 kg)  05/25/20 205 lb (93 kg)  04/20/20 206 lb (93.4 kg)    BP 116/74   Pulse 98   Temp 98.3 F (36.8 C) (Oral)   Ht 5\' 5"  (1.651 m)   Wt 205 lb (93 kg)   SpO2 96%   BMI 34.11 kg/m   Assessment and Plan: 1. Acute non-recurrent maxillary sinusitis Continue zyrtec; add singulair - amoxicillin-clavulanate (AUGMENTIN) 875-125 MG tablet; Take 1 tablet by mouth 2 (two)  times daily for 10 days.  Dispense: 20 tablet; Refill: 0  2. Centrilobular emphysema ([place]) Seen on CT screening - one are of mild concern so will repeat the scan in 6 months  3. Aortic atherosclerosis (HCC) Seen on CT - with smoking she is at increased risk despite normal lipid panel Discussed with patient - she has a strong family hx of CAD so will begin statin - atorvastatin (LIPITOR) 10 MG tablet; Take 1 tablet (10 mg total) by mouth daily.  Dispense: 90 tablet; Refill: 3   Partially dictated using Editor, commissioning. Any errors are unintentional.  Halina Maidens, MD [facility]  06/01/2020

## 2020-08-20 ENCOUNTER — Encounter: Payer: Self-pay | Admitting: Internal Medicine

## 2020-08-20 ENCOUNTER — Other Ambulatory Visit: Payer: Self-pay

## 2020-08-20 ENCOUNTER — Ambulatory Visit (INDEPENDENT_AMBULATORY_CARE_PROVIDER_SITE_OTHER): Payer: 59 | Admitting: Internal Medicine

## 2020-08-20 VITALS — BP 102/66 | HR 95 | Temp 98.1°F | Ht 65.0 in | Wt 204.0 lb

## 2020-08-20 DIAGNOSIS — E118 Type 2 diabetes mellitus with unspecified complications: Secondary | ICD-10-CM | POA: Diagnosis not present

## 2020-08-20 LAB — POCT GLYCOSYLATED HEMOGLOBIN (HGB A1C): Hemoglobin A1C: 6.4 % — AB (ref 4.0–5.6)

## 2020-08-20 LAB — POCT UA - MICROALBUMIN: Microalbumin Ur, POC: NEGATIVE mg/L

## 2020-08-20 NOTE — Progress Notes (Signed)
Date:  08/20/2020   Name:  Diamond Jackson   DOB:  August 25, 1955   MRN:  [id number]   Chief Complaint: Diabetes  Diabetes She presents for her follow-up diabetic visit. She has type 2 diabetes mellitus. The initial diagnosis of diabetes was made 6 months ago. Her disease course has been stable. There are no hypoglycemic associated symptoms. Pertinent negatives for hypoglycemia include no headaches or tremors. Pertinent negatives for diabetes include no chest pain, no fatigue, no foot paresthesias, no polydipsia, no polyphagia, no polyuria and no visual change. Current diabetic treatment includes diet. She is compliant with treatment some of the time (working 60 hours per week - less time to focus on diet). There is no compliance with monitoring of blood glucose.    Lab Results  Component Value Date   CREATININE 0.90 04/20/2020   BUN 10 04/20/2020   NA 140 04/20/2020   K 4.3 04/20/2020   CL 104 04/20/2020   CO2 20 04/20/2020   Lab Results  Component Value Date   CHOL 152 04/20/2020   HDL 40 04/20/2020   LDLCALC 93 04/20/2020   TRIG 101 04/20/2020   CHOLHDL 3.8 04/20/2020   Lab Results  Component Value Date   TSH 0.888 04/20/2020   Lab Results  Component Value Date   HGBA1C 6.4 (A) 08/20/2020   Lab Results  Component Value Date   WBC 10.4 04/20/2020   HGB 14.4 04/20/2020   HCT 43.9 04/20/2020   MCV 88 04/20/2020   PLT 237 04/20/2020   Lab Results  Component Value Date   ALT 14 04/20/2020   AST 13 04/20/2020   ALKPHOS 80 04/20/2020   BILITOT 0.3 04/20/2020   Lab Results  Component Value Date   MICROALBUR Neg 08/20/2020        Review of Systems  Constitutional: Negative for appetite change, fatigue, fever and unexpected weight change.  HENT: Negative for tinnitus and trouble swallowing.   Eyes: Negative for visual disturbance.  Respiratory: Negative for cough, chest tightness and shortness of breath.   Cardiovascular: Negative for chest pain, palpitations  and leg swelling.  Gastrointestinal: Negative for abdominal pain.  Endocrine: Negative for polydipsia, polyphagia and polyuria.  Genitourinary: Negative for dysuria and hematuria.  Musculoskeletal: Negative for arthralgias.  Neurological: Negative for tremors, numbness and headaches.  Psychiatric/Behavioral: Negative for dysphoric mood.    Patient Active Problem List   Diagnosis Date Noted  . Centrilobular emphysema ([place]) 06/01/2020  . Aortic atherosclerosis ([place]) 06/01/2020  . Personal history of colonic polyps   . Polyp of transverse colon   . Benign neoplasm of cecum   . Benign neoplasm of transverse colon   . Benign neoplasm of rectosigmoid junction   . Environmental and seasonal allergies 03/22/2015  . Pre-diabetes 03/22/2015  . Fibrocystic breast changes 03/22/2015  . Hx of peptic ulcer 03/22/2015  . Current tobacco use 03/22/2015    No Known Allergies  Past Surgical History:  Procedure Laterality Date  . ANKLE SURGERY Right 1984  . BREAST BIOPSY Left    benign two areas/clips  . CHOLECYSTECTOMY  2010  . CLEFT PALATE REPAIR     as an infant  . COLONOSCOPY  2010   normal  . COLONOSCOPY WITH PROPOFOL N/A 05/17/2018   Procedure: COLONOSCOPY WITH BIOPSIES;  Surgeon: [name], MD;  Location: [facility];  Service: Endoscopy;  Laterality: N/A;  . COLONOSCOPY WITH PROPOFOL N/A 09/29/2019   Procedure: COLONOSCOPY WITH BIOPSY;  Surgeon: [name], MD;  Location: [facility];  Service: Endoscopy;  Laterality: N/A;  priority 3  . POLYPECTOMY  05/17/2018   Procedure: POLYPECTOMY;  Surgeon: [name], MD;  Location: [facility];  Service: Endoscopy;;  . POLYPECTOMY N/A 09/29/2019   Procedure: POLYPECTOMY;  Surgeon: [name], MD;  Location: [facility];  Service: Endoscopy;  Laterality: N/A;  Clips x 2 placed at site of Hepatic Flexure Polyp site removal    Social History   Tobacco Use  . Smoking status: Current Every Day Smoker     Packs/day: 1.50    Years: 48.00    Pack years: 72.00    Types: Cigarettes  . Smokeless tobacco: Never Used  . Tobacco comment: 0.75ppd currently  Vaping Use  . Vaping Use: Never used  Substance Use Topics  . Alcohol use: No    Alcohol/week: 0.0 standard drinks  . Drug use: No     Medication list has been reviewed and updated.  Current Meds  Medication Sig  . albuterol (PROAIR HFA) 108 (90 Base) MCG/ACT inhaler Inhale 1-2 puffs into the lungs every 6 (six) hours as needed for wheezing or shortness of breath.  [name] Kitchen atorvastatin (LIPITOR) 10 MG tablet Take 1 tablet (10 mg total) by mouth daily.  . cetirizine (ZYRTEC) 10 MG tablet Take 10 mg by mouth daily as needed for allergies.  . famotidine (PEPCID) 20 MG tablet Take 20 mg by mouth 2 (two) times daily.  . Levocetirizine Dihydrochloride (XYZAL PO) Take by mouth as needed.  . montelukast (SINGULAIR) 10 MG tablet Take 1 tablet (10 mg total) by mouth at bedtime.  . OPCON-A 0.027-0.315 % SOLN For eyes    PHQ 2/9 Scores 08/20/2020 06/01/2020 04/20/2020 04/17/2019  PHQ - 2 Score 0 0 0 0  PHQ- 9 Score 0 0 0 0    GAD 7 : Generalized Anxiety Score 08/20/2020 06/01/2020 04/20/2020  Nervous, Anxious, on Edge 0 0 0  Control/stop worrying 0 0 0  Worry too much - different things 0 0 0  Trouble relaxing 0 0 0  Restless 0 0 0  Easily annoyed or irritable 0 0 0  Afraid - awful might happen 0 0 0  Total GAD 7 Score 0 0 0    BP Readings from Last 3 Encounters:  08/20/20 102/66  06/01/20 116/74  04/20/20 106/68    Physical Exam Vitals and nursing note reviewed.  Constitutional:      General: She is not in acute distress.    Appearance: Normal appearance. She is well-developed.  HENT:     Head: Normocephalic and atraumatic.  Neck:     Vascular: No carotid bruit.  Cardiovascular:     Rate and Rhythm: Normal rate and regular rhythm.     Pulses: Normal pulses.     Heart sounds: No murmur heard.   Pulmonary:     Effort: Pulmonary  effort is normal. No respiratory distress.     Breath sounds: No wheezing or rhonchi.  Musculoskeletal:     Cervical back: Normal range of motion.     Right lower leg: No edema.     Left lower leg: No edema.  Lymphadenopathy:     Cervical: No cervical adenopathy.  Skin:    General: Skin is warm and dry.     Findings: No rash.  Neurological:     General: No focal deficit present.     Mental Status: She is alert and oriented to person, place, and time.  Psychiatric:  Mood and Affect: Mood normal.        Behavior: Behavior normal.     Wt Readings from Last 3 Encounters:  08/20/20 204 lb (92.5 kg)  06/01/20 205 lb (93 kg)  05/25/20 205 lb (93 kg)    BP 102/66   Pulse 95   Temp 98.1 F (36.7 C) (Oral)   Ht 5\' 5"  (1.651 m)   Wt 204 lb (92.5 kg)   SpO2 95%   BMI 33.95 kg/m   Assessment and Plan: 1. Type II diabetes mellitus with complication (HCC) Y4I slightly improved with diet changes - continue to work on diet and weight loss Will get eye exam notes from  eye Follow up in 4 months - POCT glycosylated hemoglobin (Hb A1C) - POCT UA - Microalbumin   Partially dictated using Editor, commissioning. Any errors are unintentional.  Halina Maidens, MD [facility]  08/20/2020

## 2020-10-13 ENCOUNTER — Telehealth: Payer: Self-pay

## 2020-10-13 ENCOUNTER — Inpatient Hospital Stay
Admission: RE | Admit: 2020-10-13 | Discharge: 2020-10-13 | Disposition: A | Discharge status: Home / Self Care | Payer: 59 | Source: Ambulatory Visit | Attending: Internal Medicine | Admitting: Internal Medicine

## 2020-10-13 NOTE — Telephone Encounter (Signed)
Tried calling patient to remind her to have her mammogram scheduled ASAP but her VM was not set up and could not leave a message to remind patient. If patient returns call PEC can give patient this message.

## 2020-10-14 ENCOUNTER — Ambulatory Visit
Admission: RE | Admit: 2020-10-14 | Discharge: 2020-10-14 | Disposition: A | Discharge status: Home / Self Care | Payer: 59 | Source: Ambulatory Visit | Attending: Internal Medicine | Admitting: Internal Medicine

## 2020-10-14 ENCOUNTER — Other Ambulatory Visit: Payer: Self-pay

## 2020-10-14 DIAGNOSIS — Z1231 Encounter for screening mammogram for malignant neoplasm of breast: Secondary | ICD-10-CM | POA: Insufficient documentation

## 2020-12-14 ENCOUNTER — Other Ambulatory Visit: Payer: Self-pay

## 2020-12-14 ENCOUNTER — Ambulatory Visit (INDEPENDENT_AMBULATORY_CARE_PROVIDER_SITE_OTHER): Payer: 59 | Admitting: Internal Medicine

## 2020-12-14 ENCOUNTER — Encounter: Payer: Self-pay | Admitting: Internal Medicine

## 2020-12-14 VITALS — BP 118/78 | HR 89 | Resp 16 | Ht 65.0 in | Wt 206.0 lb

## 2020-12-14 DIAGNOSIS — E118 Type 2 diabetes mellitus with unspecified complications: Secondary | ICD-10-CM | POA: Diagnosis not present

## 2020-12-14 DIAGNOSIS — R0981 Nasal congestion: Secondary | ICD-10-CM | POA: Diagnosis not present

## 2020-12-14 LAB — POCT GLYCOSYLATED HEMOGLOBIN (HGB A1C): Hemoglobin A1C: 6.8 % — AB (ref 4.0–5.6)

## 2020-12-14 NOTE — Progress Notes (Signed)
Date:  12/14/2020   Name:  Diamond Jackson   DOB:  24-Oct-1955   MRN:  [id number]   Chief Complaint: Diabetes and Ear Pain (Right ear pain and drainage. Started a week ago. Sore under ear around lymph nodes in neck.)  Diabetes She presents for her follow-up diabetic visit. She has type 2 diabetes mellitus. Her disease course has been improving. Pertinent negatives for hypoglycemia include no headaches or tremors. Pertinent negatives for diabetes include no chest pain, no fatigue, no polydipsia and no polyuria. Current diabetic treatment includes diet. She is compliant with treatment most of the time.  Sinus Problem This is a recurrent problem. There has been no fever. Associated symptoms include sinus pressure and swollen glands. Pertinent negatives include no coughing, headaches or shortness of breath. Past treatments include oral decongestants (zyrtec). The treatment provided mild relief.   Lab Results  Component Value Date   CREATININE 0.90 04/20/2020   BUN 10 04/20/2020   NA 140 04/20/2020   K 4.3 04/20/2020   CL 104 04/20/2020   CO2 20 04/20/2020   Lab Results  Component Value Date   CHOL 152 04/20/2020   HDL 40 04/20/2020   LDLCALC 93 04/20/2020   TRIG 101 04/20/2020   CHOLHDL 3.8 04/20/2020   Lab Results  Component Value Date   TSH 0.888 04/20/2020   Lab Results  Component Value Date   HGBA1C 6.8 (A) 12/14/2020   Lab Results  Component Value Date   WBC 10.4 04/20/2020   HGB 14.4 04/20/2020   HCT 43.9 04/20/2020   MCV 88 04/20/2020   PLT 237 04/20/2020   Lab Results  Component Value Date   ALT 14 04/20/2020   AST 13 04/20/2020   ALKPHOS 80 04/20/2020   BILITOT 0.3 04/20/2020     Review of Systems  Constitutional:  Negative for appetite change, fatigue, fever and unexpected weight change.  HENT:  Positive for sinus pressure. Negative for tinnitus and trouble swallowing.   Eyes:  Negative for visual disturbance.  Respiratory:  Negative for cough, chest  tightness and shortness of breath.   Cardiovascular:  Negative for chest pain, palpitations and leg swelling.  Gastrointestinal:  Negative for abdominal pain.  Endocrine: Negative for polydipsia and polyuria.  Genitourinary:  Negative for dysuria and hematuria.  Musculoskeletal:  Negative for arthralgias.  Neurological:  Negative for tremors, numbness and headaches.  Psychiatric/Behavioral:  Negative for dysphoric mood.    Patient Active Problem List   Diagnosis Date Noted   Centrilobular emphysema ([place]) 06/01/2020   Aortic atherosclerosis ([place]) 06/01/2020   Personal history of colonic polyps    Polyp of transverse colon    Benign neoplasm of cecum    Benign neoplasm of transverse colon    Benign neoplasm of rectosigmoid junction    Environmental and seasonal allergies 03/22/2015   Fibrocystic breast changes 03/22/2015   Hx of peptic ulcer 03/22/2015   Current tobacco use 03/22/2015    No Known Allergies  Past Surgical History:  Procedure Laterality Date   ANKLE SURGERY Right 1984   BREAST BIOPSY Left    benign two areas/clips   CHOLECYSTECTOMY  2010   CLEFT PALATE REPAIR     as an infant   COLONOSCOPY  2010   normal   COLONOSCOPY WITH PROPOFOL N/A 05/17/2018   Procedure: COLONOSCOPY WITH BIOPSIES;  Surgeon: [name], MD;  Location: [place];  Service: Endoscopy;  Laterality: N/A;   COLONOSCOPY WITH PROPOFOL N/A 09/29/2019   Procedure:  COLONOSCOPY WITH BIOPSY;  Surgeon: [name], MD;  Location: [place];  Service: Endoscopy;  Laterality: N/A;  priority 3   POLYPECTOMY  05/17/2018   Procedure: POLYPECTOMY;  Surgeon: [name], MD;  Location: [place];  Service: Endoscopy;;   POLYPECTOMY N/A 09/29/2019   Procedure: POLYPECTOMY;  Surgeon: [name], MD;  Location: [place];  Service: Endoscopy;  Laterality: N/A;  Clips x 2 placed at site of Hepatic Flexure Polyp site removal    Social History   Tobacco Use   Smoking  status: Every Day    Packs/day: 1.50    Years: 48.00    Pack years: 72.00    Types: Cigarettes   Smokeless tobacco: Never   Tobacco comments:    0.75ppd currently  Vaping Use   Vaping Use: Never used  Substance Use Topics   Alcohol use: No    Alcohol/week: 0.0 standard drinks   Drug use: No     Medication list has been reviewed and updated.  Current Meds  Medication Sig   albuterol (PROAIR HFA) 108 (90 Base) MCG/ACT inhaler Inhale 1-2 puffs into the lungs every 6 (six) hours as needed for wheezing or shortness of breath.   atorvastatin (LIPITOR) 10 MG tablet Take 1 tablet (10 mg total) by mouth daily.   cetirizine (ZYRTEC) 10 MG tablet Take 10 mg by mouth daily as needed for allergies.   famotidine (PEPCID) 20 MG tablet Take 20 mg by mouth 2 (two) times daily.   Levocetirizine Dihydrochloride (XYZAL PO) Take by mouth as needed.   montelukast (SINGULAIR) 10 MG tablet Take 1 tablet (10 mg total) by mouth at bedtime.   OPCON-A 0.027-0.315 % SOLN For eyes    PHQ 2/9 Scores 12/14/2020 08/20/2020 06/01/2020 04/20/2020  PHQ - 2 Score 0 0 0 0  PHQ- 9 Score 0 0 0 0    GAD 7 : Generalized Anxiety Score 12/14/2020 08/20/2020 06/01/2020 04/20/2020  Nervous, Anxious, on Edge 0 0 0 0  Control/stop worrying 0 0 0 0  Worry too much - different things 0 0 0 0  Trouble relaxing 0 0 0 0  Restless 0 0 0 0  Easily annoyed or irritable 0 0 0 0  Afraid - awful might happen 0 0 0 0  Total GAD 7 Score 0 0 0 0  Anxiety Difficulty Not difficult at all - - -    BP Readings from Last 3 Encounters:  12/14/20 118/78  08/20/20 102/66  06/01/20 116/74    Physical Exam Vitals and nursing note reviewed.  Constitutional:      General: She is not in acute distress.    Appearance: She is well-developed.  HENT:     Head: Normocephalic and atraumatic.     Right Ear: Tympanic membrane and ear canal normal.     Left Ear: Tympanic membrane and ear canal normal.     Nose:     Right Sinus: Maxillary sinus  tenderness present. No frontal sinus tenderness.     Left Sinus: Maxillary sinus tenderness present. No frontal sinus tenderness.  Neck:     Vascular: No carotid bruit.  Cardiovascular:     Rate and Rhythm: Normal rate and regular rhythm.     Pulses: Normal pulses.     Heart sounds: No murmur heard. Pulmonary:     Effort: Pulmonary effort is normal. No respiratory distress.  Musculoskeletal:     Cervical back: Normal range of motion.  Lymphadenopathy:     Cervical:  No cervical adenopathy.  Skin:    General: Skin is warm and dry.     Capillary Refill: Capillary refill takes less than 2 seconds.     Findings: No rash.  Neurological:     Mental Status: She is alert and oriented to person, place, and time.  Psychiatric:        Mood and Affect: Mood normal.        Behavior: Behavior normal.    Wt Readings from Last 3 Encounters:  12/14/20 206 lb (93.4 kg)  08/20/20 204 lb (92.5 kg)  06/01/20 205 lb (93 kg)    BP 118/78 (BP Location: Right Arm, Patient Position: Sitting, Cuff Size: Large)   Pulse 89   Resp 16   Ht 5\' 5"  (1.651 m)   Wt 206 lb (93.4 kg)   SpO2 96%   BMI 34.28 kg/m   Assessment and Plan: 1. Type II diabetes mellitus with complication (HCC) Blood sugar is slightly worse. Reinforced diet and exercise and aim to lose 5-10 lbs by the next visit - POCT glycosylated hemoglobin (Hb A1C) = 6.8 up from 6.2  2. Nasal sinus congestion Stop zyrtec. Take plain sudafed and try Flonase again with appropriate application technique   Partially dictated using Editor, commissioning. Any errors are unintentional.  Halina Maidens, MD [facility]  12/14/2020

## 2020-12-20 ENCOUNTER — Telehealth: Payer: Self-pay | Admitting: *Deleted

## 2020-12-20 DIAGNOSIS — Z720 Tobacco use: Secondary | ICD-10-CM

## 2020-12-20 DIAGNOSIS — Z87891 Personal history of nicotine dependence: Secondary | ICD-10-CM

## 2020-12-20 DIAGNOSIS — F17200 Nicotine dependence, unspecified, uncomplicated: Secondary | ICD-10-CM

## 2020-12-20 DIAGNOSIS — Z122 Encounter for screening for malignant neoplasm of respiratory organs: Secondary | ICD-10-CM

## 2020-12-20 NOTE — Telephone Encounter (Signed)
Spoke with patient via telephone re: scheduling her 6 month follow up lung screening scan. Current smoker, 72 pack years. Scan scheduled for 12/28/20 @ 4:30 pm.

## 2020-12-28 ENCOUNTER — Other Ambulatory Visit: Payer: Self-pay

## 2020-12-28 ENCOUNTER — Ambulatory Visit
Admission: RE | Admit: 2020-12-28 | Discharge: 2020-12-28 | Disposition: A | Discharge status: Home / Self Care | Payer: 59 | Source: Ambulatory Visit | Attending: Acute Care | Admitting: Acute Care

## 2020-12-28 DIAGNOSIS — F17200 Nicotine dependence, unspecified, uncomplicated: Secondary | ICD-10-CM

## 2020-12-28 DIAGNOSIS — Z87891 Personal history of nicotine dependence: Secondary | ICD-10-CM | POA: Diagnosis present

## 2020-12-28 DIAGNOSIS — Z122 Encounter for screening for malignant neoplasm of respiratory organs: Secondary | ICD-10-CM | POA: Diagnosis present

## 2020-12-31 ENCOUNTER — Encounter: Payer: Self-pay | Admitting: *Deleted

## 2021-01-02 ENCOUNTER — Encounter: Payer: Self-pay | Admitting: Internal Medicine

## 2021-01-02 DIAGNOSIS — I2510 Atherosclerotic heart disease of native coronary artery without angina pectoris: Secondary | ICD-10-CM | POA: Insufficient documentation

## 2021-01-03 ENCOUNTER — Telehealth: Payer: Self-pay

## 2021-01-03 NOTE — Telephone Encounter (Signed)
Called pt with results. Could not leave VM. VM was not set up. Please read pt results.  [facility] nurse may give results to patient if they return call to clinic, a CRM has been created.  KP

## 2021-01-03 NOTE — Telephone Encounter (Signed)
-----   Message from Glean Hess, MD sent at 01/02/2021  3:22 PM EDT ----- CT of the lungs does not show any lesion suspicious for cancer.  However it does show atherosclerosis (cholesterol plaque buildup) in the aorta and in the arteries of the heart.  For this reason, I recommend Cardiology consultation for possible stress testing.  ----- Message ----- From: Lieutenant Diego, RN Sent: 12/31/2020  12:38 PM EDT To: Glean Hess, MD  We will send interpretation of these results to the patient. Please look in Epic Letters to see results letter. We will also contact them close to the time their annual lung screening scan is due next year for scheduling.  Thank you, Raquel Sarna

## 2021-01-04 ENCOUNTER — Other Ambulatory Visit: Payer: Self-pay

## 2021-01-04 DIAGNOSIS — I700 Atherosclerosis of aorta: Secondary | ICD-10-CM

## 2021-01-04 NOTE — Telephone Encounter (Signed)
Patient called and given results as noted by Dr. Army Melia, she verbalized understanding. She says she is ok for the cardiology consult as long as she can stay in [place]. I advised I will send this to Dr. Army Melia to consult a cardiologist.

## 2021-01-04 NOTE — Telephone Encounter (Signed)
Placed referral for cardiology at [facility] in [facility].

## 2021-01-10 ENCOUNTER — Telehealth: Payer: Self-pay | Admitting: Acute Care

## 2021-01-10 ENCOUNTER — Encounter: Payer: Self-pay | Admitting: *Deleted

## 2021-01-10 DIAGNOSIS — Z720 Tobacco use: Secondary | ICD-10-CM

## 2021-01-10 DIAGNOSIS — Z87891 Personal history of nicotine dependence: Secondary | ICD-10-CM

## 2021-01-10 NOTE — Telephone Encounter (Signed)
CT result letter sent to pt via Mychart. Copy of CT faxed to PCP. Order placed for 1 yr f/u low dose ct.   

## 2021-01-11 NOTE — Progress Notes (Signed)
CT result letter sent to pt via Mychart by Doroteo Glassman on  01/10/2021. Copy of CT faxed to PCP. Order placed for 1 yr f/u low dose ct.

## 2021-02-11 ENCOUNTER — Other Ambulatory Visit: Payer: Self-pay

## 2021-02-11 ENCOUNTER — Encounter: Payer: Self-pay | Admitting: Internal Medicine

## 2021-02-11 ENCOUNTER — Ambulatory Visit (INDEPENDENT_AMBULATORY_CARE_PROVIDER_SITE_OTHER): Payer: 59 | Admitting: Internal Medicine

## 2021-02-11 VITALS — BP 122/70 | HR 98 | Temp 98.2°F | Ht 65.0 in | Wt 207.0 lb

## 2021-02-11 DIAGNOSIS — N764 Abscess of vulva: Secondary | ICD-10-CM

## 2021-02-11 MED ORDER — AMOXICILLIN-POT CLAVULANATE 875-125 MG PO TABS: 1.0000 | ORAL_TABLET | Freq: Two times a day (BID) | ORAL | 0 refills | Status: AC

## 2021-02-11 NOTE — Patient Instructions (Signed)
Soak 1-2 times daily in hot bath.  Keep area clean but no need to apply anything topically.  Call or follow up if not mostly resolved once antibiotics are complete.

## 2021-02-11 NOTE — Progress Notes (Signed)
Date:  02/11/2021   Name:  Diamond Jackson   DOB:  [birth year]   MRN:  [id number]   Chief Complaint: Cyst (X3 days, Vaginal area, not painful, swollen, getting bigger, redness, smelly drainage)  HPI  Lab Results  Component Value Date   CREATININE 0.90 04/20/2020   BUN 10 04/20/2020   NA 140 04/20/2020   K 4.3 04/20/2020   CL 104 04/20/2020   CO2 20 04/20/2020   Lab Results  Component Value Date   CHOL 152 04/20/2020   HDL 40 04/20/2020   LDLCALC 93 04/20/2020   TRIG 101 04/20/2020   CHOLHDL 3.8 04/20/2020   Lab Results  Component Value Date   TSH 0.888 04/20/2020   Lab Results  Component Value Date   HGBA1C 6.8 (A) 12/14/2020   Lab Results  Component Value Date   WBC 10.4 04/20/2020   HGB 14.4 04/20/2020   HCT 43.9 04/20/2020   MCV 88 04/20/2020   PLT 237 04/20/2020   Lab Results  Component Value Date   ALT 14 04/20/2020   AST 13 04/20/2020   ALKPHOS 80 04/20/2020   BILITOT 0.3 04/20/2020     Review of Systems  Constitutional:  Negative for chills, fatigue and fever.  Genitourinary:  Positive for genital sores. Negative for vaginal bleeding and vaginal discharge.   Patient Active Problem List   Diagnosis Date Noted   Coronary artery calcification seen on computed tomography 01/02/2021   Centrilobular emphysema ([place]) 06/01/2020   Aortic atherosclerosis ([place]) 06/01/2020   Personal history of colonic polyps    Polyp of transverse colon    Benign neoplasm of cecum    Benign neoplasm of transverse colon    Benign neoplasm of rectosigmoid junction    Environmental and seasonal allergies 03/22/2015   Fibrocystic breast changes 03/22/2015   Hx of peptic ulcer 03/22/2015   Current tobacco use 03/22/2015    No Known Allergies  Past Surgical History:  Procedure Laterality Date   ANKLE SURGERY Right 1984   BREAST BIOPSY Left    benign two areas/clips   CHOLECYSTECTOMY  2010   CLEFT PALATE REPAIR     as an infant   COLONOSCOPY  2010   normal    COLONOSCOPY WITH PROPOFOL N/A 05/17/2018   Procedure: COLONOSCOPY WITH BIOPSIES;  Surgeon: [name], MD;  Location: [place];  Service: Endoscopy;  Laterality: N/A;   COLONOSCOPY WITH PROPOFOL N/A 09/29/2019   Procedure: COLONOSCOPY WITH BIOPSY;  Surgeon: [name], MD;  Location: [place];  Service: Endoscopy;  Laterality: N/A;  priority 3   POLYPECTOMY  05/17/2018   Procedure: POLYPECTOMY;  Surgeon: [name], MD;  Location: [place];  Service: Endoscopy;;   POLYPECTOMY N/A 09/29/2019   Procedure: POLYPECTOMY;  Surgeon: [name], MD;  Location: [place];  Service: Endoscopy;  Laterality: N/A;  Clips x 2 placed at site of Hepatic Flexure Polyp site removal    Social History   Tobacco Use   Smoking status: Every Day    Packs/day: 1.50    Years: 48.00    Pack years: 72.00    Types: Cigarettes   Smokeless tobacco: Never   Tobacco comments:    0.75ppd currently  Vaping Use   Vaping Use: Never used  Substance Use Topics   Alcohol use: No    Alcohol/week: 0.0 standard drinks   Drug use: No     Medication list has been reviewed and updated.  Current Meds  Medication  Sig   albuterol (PROAIR HFA) 108 (90 Base) MCG/ACT inhaler Inhale 1-2 puffs into the lungs every 6 (six) hours as needed for wheezing or shortness of breath.   atorvastatin (LIPITOR) 10 MG tablet Take 1 tablet (10 mg total) by mouth daily.   cetirizine (ZYRTEC) 10 MG tablet Take 10 mg by mouth daily as needed for allergies.   famotidine (PEPCID) 20 MG tablet Take 20 mg by mouth daily.   Levocetirizine Dihydrochloride (XYZAL PO) Take by mouth as needed.   montelukast (SINGULAIR) 10 MG tablet Take by mouth.   OPCON-A 0.027-0.315 % SOLN For eyes    PHQ 2/9 Scores 02/11/2021 12/14/2020 08/20/2020 06/01/2020  PHQ - 2 Score 0 0 0 0  PHQ- 9 Score 0 0 0 0    GAD 7 : Generalized Anxiety Score 02/11/2021 12/14/2020 08/20/2020 06/01/2020  Nervous, Anxious, on Edge 0 0 0 0   Control/stop worrying 0 0 0 0  Worry too much - different things 0 0 0 0  Trouble relaxing 0 0 0 0  Restless 0 0 0 0  Easily annoyed or irritable 0 0 0 0  Afraid - awful might happen 0 0 0 0  Total GAD 7 Score 0 0 0 0  Anxiety Difficulty Not difficult at all Not difficult at all - -    BP Readings from Last 3 Encounters:  02/11/21 122/70  12/14/20 118/78  08/20/20 102/66    Physical Exam Vitals and nursing note reviewed.  Constitutional:      General: She is not in acute distress.    Appearance: She is well-developed.  HENT:     Head: Normocephalic and atraumatic.  Pulmonary:     Effort: Pulmonary effort is normal. No respiratory distress.  Genitourinary:   Lymphadenopathy:     Lower Body: Left inguinal adenopathy present.  Skin:    General: Skin is warm and dry.     Findings: No rash.  Neurological:     Mental Status: She is alert and oriented to person, place, and time.  Psychiatric:        Mood and Affect: Mood normal.        Behavior: Behavior normal.    Wt Readings from Last 3 Encounters:  02/11/21 207 lb (93.9 kg)  12/14/20 206 lb (93.4 kg)  08/20/20 204 lb (92.5 kg)    BP 122/70   Pulse 98   Temp 98.2 F (36.8 C) (Oral)   Ht '5\' 5"'$  (1.651 m)   Wt 207 lb (93.9 kg)   SpO2 95%   BMI 34.45 kg/m   Assessment and Plan: 1. Left genital labial abscess Recommend warm soaks without any topical agent and avoid manipulation. Follow up if not improved once antibiotics are complete. - amoxicillin-clavulanate (AUGMENTIN) 875-125 MG tablet; Take 1 tablet by mouth 2 (two) times daily for 10 days.  Dispense: 20 tablet; Refill: 0   Partially dictated using Editor, commissioning. Any errors are unintentional.  Halina Maidens, MD [facility]  02/11/2021

## 2021-03-26 ENCOUNTER — Other Ambulatory Visit: Payer: Self-pay | Admitting: Internal Medicine

## 2021-03-26 DIAGNOSIS — Z720 Tobacco use: Secondary | ICD-10-CM

## 2021-03-26 NOTE — Telephone Encounter (Signed)
Requested Prescriptions  Pending Prescriptions Disp Refills  . albuterol (VENTOLIN HFA) 108 (90 Base) MCG/ACT inhaler [Pharmacy Med Name: Albuterol Sulfate HFA 108 (90 Base) MCG/ACT Inhalation Aerosol Solution] 18 g 0    Sig: INHALE 1 TO 2 PUFFS BY MOUTH EVERY 6 HOURS AS NEEDED FOR WHEEZING FOR SHORTNESS OF BREATH     Pulmonology:  Beta Agonists Failed - 03/26/2021  9:54 AM      Failed - One inhaler should last at least one month. If the patient is requesting refills earlier, contact the patient to check for uncontrolled symptoms.      Passed - Valid encounter within last 12 months    Recent Outpatient Visits          1 month ago Left genital labial abscess   [facility] Glean Hess, MD   3 months ago Type II diabetes mellitus with complication [facility])   [facility] Glean Hess, MD   7 months ago Type II diabetes mellitus with complication [facility])   [facility] Glean Hess, MD   9 months ago Acute non-recurrent maxillary sinusitis   [facility] Glean Hess, MD   11 months ago Annual physical exam   [facility] Glean Hess, MD      Future Appointments            In 3 weeks Army Melia Jesse Sans, MD [facility], [facility]

## 2021-04-22 ENCOUNTER — Encounter: Payer: Self-pay | Admitting: Internal Medicine

## 2021-04-22 ENCOUNTER — Ambulatory Visit (INDEPENDENT_AMBULATORY_CARE_PROVIDER_SITE_OTHER): Payer: 59 | Admitting: Internal Medicine

## 2021-04-22 ENCOUNTER — Other Ambulatory Visit: Payer: Self-pay

## 2021-04-22 VITALS — BP 124/68 | HR 88 | Ht 65.0 in | Wt 208.8 lb

## 2021-04-22 DIAGNOSIS — E118 Type 2 diabetes mellitus with unspecified complications: Secondary | ICD-10-CM

## 2021-04-22 DIAGNOSIS — I700 Atherosclerosis of aorta: Secondary | ICD-10-CM

## 2021-04-22 DIAGNOSIS — Z0000 Encounter for general adult medical examination without abnormal findings: Secondary | ICD-10-CM | POA: Diagnosis not present

## 2021-04-22 DIAGNOSIS — J432 Centrilobular emphysema: Secondary | ICD-10-CM

## 2021-04-22 DIAGNOSIS — Z720 Tobacco use: Secondary | ICD-10-CM

## 2021-04-22 DIAGNOSIS — E1169 Type 2 diabetes mellitus with other specified complication: Secondary | ICD-10-CM | POA: Diagnosis not present

## 2021-04-22 DIAGNOSIS — Z1231 Encounter for screening mammogram for malignant neoplasm of breast: Secondary | ICD-10-CM | POA: Diagnosis not present

## 2021-04-22 DIAGNOSIS — E785 Hyperlipidemia, unspecified: Secondary | ICD-10-CM

## 2021-04-22 DIAGNOSIS — Z8711 Personal history of peptic ulcer disease: Secondary | ICD-10-CM

## 2021-04-22 DIAGNOSIS — Z23 Encounter for immunization: Secondary | ICD-10-CM

## 2021-04-22 DIAGNOSIS — Z13820 Encounter for screening for osteoporosis: Secondary | ICD-10-CM

## 2021-04-22 LAB — POCT URINALYSIS DIPSTICK
Bilirubin, UA: NEGATIVE
Blood, UA: NEGATIVE
Glucose, UA: NEGATIVE
Ketones, UA: NEGATIVE
Leukocytes, UA: NEGATIVE
Nitrite, UA: NEGATIVE
Protein, UA: NEGATIVE
Spec Grav, UA: 1.02 (ref 1.010–1.025)
Urobilinogen, UA: 0.2 E.U./dL
pH, UA: 5 (ref 5.0–8.0)

## 2021-04-22 MED ORDER — MONTELUKAST SODIUM 10 MG PO TABS: 10.0000 mg | ORAL_TABLET | Freq: Every day | ORAL | 3 refills | Status: DC

## 2021-04-22 NOTE — Progress Notes (Signed)
Date:  04/22/2021   Name:  Diamond Jackson   DOB:  1955-09-21   MRN:  [id number]   Chief Complaint: Annual Exam (Breast Exam. UA. Foot Exam.) Diamond Jackson is a 65 y.o. female who presents today for her Complete Annual Exam. She feels fairly well. She reports exercising very little. She reports she is sleeping fairly well. Breast complaints none.  Mammogram: 10/2020 DEXA: none Pap smear: 04/2020 neg with co-testing Colonoscopy: 09/2019 repeat in one year  Immunization History  Administered Date(s) Administered   Influenza,inj,Quad PF,6+ Mos 04/15/2018, 04/17/2019, 04/20/2020   Influenza-Unspecified 03/13/2015, 03/16/2016, 03/30/2017   PFIZER(Purple Top)SARS-COV-2 Vaccination 09/18/2019, 10/14/2019, 05/27/2020   Tdap 04/17/2013   Zoster Recombinat (Shingrix) 05/29/2019, 08/27/2019    Hyperlipidemia This is a chronic problem. The problem is controlled. Pertinent negatives include no chest pain or shortness of breath. Current antihyperlipidemic treatment includes statins. The current treatment provides significant improvement of lipids.  Diabetes She presents for her follow-up diabetic visit. She has type 2 diabetes mellitus. Her disease course has been improving. Pertinent negatives for hypoglycemia include no dizziness, headaches, nervousness/anxiousness or tremors. Pertinent negatives for diabetes include no chest pain, no fatigue, no polydipsia and no polyuria. Current diabetic treatment includes diet. An ACE inhibitor/angiotensin II receptor blocker is not being taken.   COPD - followed by Dr. Raul Del.  She is on Albuterol and Anoro.  She is participating in the lung cancer screening program.  Lab Results  Component Value Date   CREATININE 0.90 04/20/2020   BUN 10 04/20/2020   NA 140 04/20/2020   K 4.3 04/20/2020   CL 104 04/20/2020   CO2 20 04/20/2020   Lab Results  Component Value Date   CHOL 152 04/20/2020   HDL 40 04/20/2020   LDLCALC 93 04/20/2020   TRIG 101  04/20/2020   CHOLHDL 3.8 04/20/2020   Lab Results  Component Value Date   TSH 0.888 04/20/2020   Lab Results  Component Value Date   HGBA1C 6.8 (A) 12/14/2020   Lab Results  Component Value Date   WBC 10.4 04/20/2020   HGB 14.4 04/20/2020   HCT 43.9 04/20/2020   MCV 88 04/20/2020   PLT 237 04/20/2020   Lab Results  Component Value Date   ALT 14 04/20/2020   AST 13 04/20/2020   ALKPHOS 80 04/20/2020   BILITOT 0.3 04/20/2020     Review of Systems  Constitutional:  Negative for chills, fatigue and fever.  HENT:  Negative for congestion, hearing loss, tinnitus, trouble swallowing and voice change.   Eyes:  Negative for visual disturbance.  Respiratory:  Negative for cough, chest tightness, shortness of breath and wheezing.   Cardiovascular:  Negative for chest pain, palpitations and leg swelling.  Gastrointestinal:  Negative for abdominal pain, constipation, diarrhea and vomiting.  Endocrine: Negative for polydipsia and polyuria.  Genitourinary:  Negative for dysuria, frequency, genital sores, vaginal bleeding and vaginal discharge.  Musculoskeletal:  Negative for arthralgias, gait problem and joint swelling.  Skin:  Negative for color change and rash.  Neurological:  Positive for numbness. Negative for dizziness, tremors, light-headedness and headaches.  Hematological:  Negative for adenopathy. Does not bruise/bleed easily.  Psychiatric/Behavioral:  Negative for dysphoric mood and sleep disturbance. The patient is not nervous/anxious.    Patient Active Problem List   Diagnosis Date Noted   Coronary artery calcification seen on computed tomography 01/02/2021   Centrilobular emphysema ([place]) 06/01/2020   Aortic atherosclerosis ([place]) 06/01/2020   Personal history of colonic  polyps    Polyp of transverse colon    Benign neoplasm of cecum    Benign neoplasm of transverse colon    Benign neoplasm of rectosigmoid junction    Environmental and seasonal allergies 03/22/2015    Fibrocystic breast changes 03/22/2015   Hx of peptic ulcer 03/22/2015   Current tobacco use 03/22/2015    No Known Allergies  Past Surgical History:  Procedure Laterality Date   ANKLE SURGERY Right 1984   BREAST BIOPSY Left    benign two areas/clips   CHOLECYSTECTOMY  2010   CLEFT PALATE REPAIR     as an infant   COLONOSCOPY  2010   normal   COLONOSCOPY WITH PROPOFOL N/A 05/17/2018   Procedure: COLONOSCOPY WITH BIOPSIES;  Surgeon: [name], MD;  Location: [place];  Service: Endoscopy;  Laterality: N/A;   COLONOSCOPY WITH PROPOFOL N/A 09/29/2019   Procedure: COLONOSCOPY WITH BIOPSY;  Surgeon: [name], MD;  Location: [place];  Service: Endoscopy;  Laterality: N/A;  priority 3   POLYPECTOMY  05/17/2018   Procedure: POLYPECTOMY;  Surgeon: [name], MD;  Location: [place];  Service: Endoscopy;;   POLYPECTOMY N/A 09/29/2019   Procedure: POLYPECTOMY;  Surgeon: [name], MD;  Location: [place];  Service: Endoscopy;  Laterality: N/A;  Clips x 2 placed at site of Hepatic Flexure Polyp site removal    Social History   Tobacco Use   Smoking status: Every Day    Packs/day: 1.50    Years: 48.00    Pack years: 72.00    Types: Cigarettes   Smokeless tobacco: Never   Tobacco comments:    0.75ppd currently  Vaping Use   Vaping Use: Never used  Substance Use Topics   Alcohol use: No    Alcohol/week: 0.0 standard drinks   Drug use: No     Medication list has been reviewed and updated.  Current Meds  Medication Sig   albuterol (VENTOLIN HFA) 108 (90 Base) MCG/ACT inhaler INHALE 1 TO 2 PUFFS BY MOUTH EVERY 6 HOURS AS NEEDED FOR WHEEZING FOR SHORTNESS OF BREATH   ANORO ELLIPTA 62.5-25 MCG/ACT AEPB 1 puff daily.   atorvastatin (LIPITOR) 10 MG tablet Take 1 tablet (10 mg total) by mouth daily.   cetirizine (ZYRTEC) 10 MG tablet Take 10 mg by mouth daily as needed for allergies.   famotidine (PEPCID) 20 MG tablet Take 20 mg by  mouth daily.   Levocetirizine Dihydrochloride (XYZAL PO) Take by mouth as needed.   montelukast (SINGULAIR) 10 MG tablet Take by mouth.   OPCON-A 0.027-0.315 % SOLN For eyes    PHQ 2/9 Scores 04/22/2021 02/11/2021 12/14/2020 08/20/2020  PHQ - 2 Score 2 0 0 0  PHQ- 9 Score 5 0 0 0    GAD 7 : Generalized Anxiety Score 04/22/2021 02/11/2021 12/14/2020 08/20/2020  Nervous, Anxious, on Edge 0 0 0 0  Control/stop worrying 0 0 0 0  Worry too much - different things 0 0 0 0  Trouble relaxing 0 0 0 0  Restless 0 0 0 0  Easily annoyed or irritable 0 0 0 0  Afraid - awful might happen 0 0 0 0  Total GAD 7 Score 0 0 0 0  Anxiety Difficulty Not difficult at all Not difficult at all Not difficult at all -    BP Readings from Last 3 Encounters:  04/22/21 132/68  02/11/21 122/70  12/14/20 118/78    Physical Exam Vitals and nursing note reviewed.  Constitutional:      General: She is not in acute distress.    Appearance: She is well-developed.  HENT:     Head: Normocephalic and atraumatic.     Right Ear: Tympanic membrane and ear canal normal.     Left Ear: Tympanic membrane and ear canal normal.     Nose:     Right Sinus: No maxillary sinus tenderness.     Left Sinus: No maxillary sinus tenderness.  Eyes:     General: No scleral icterus.       Right eye: No discharge.        Left eye: No discharge.     Conjunctiva/sclera: Conjunctivae normal.  Neck:     Thyroid: No thyromegaly.     Vascular: No carotid bruit.  Cardiovascular:     Rate and Rhythm: Normal rate and regular rhythm.     Pulses: Normal pulses.     Heart sounds: Normal heart sounds.  Pulmonary:     Effort: Pulmonary effort is normal. No respiratory distress.     Breath sounds: No wheezing.  Chest:  Breasts:    Right: No mass, nipple discharge, skin change or tenderness.     Left: No mass, nipple discharge, skin change or tenderness.  Abdominal:     General: Bowel sounds are normal.     Palpations: Abdomen is soft.      Tenderness: There is no abdominal tenderness.  Musculoskeletal:     Cervical back: Normal range of motion. No erythema.     Right lower leg: No edema.     Left lower leg: No edema.  Lymphadenopathy:     Cervical: No cervical adenopathy.  Skin:    General: Skin is warm and dry.     Findings: No rash.  Neurological:     Mental Status: She is alert and oriented to person, place, and time.     Cranial Nerves: No cranial nerve deficit.     Sensory: No sensory deficit.     Deep Tendon Reflexes: Reflexes are normal and symmetric.  Psychiatric:        Attention and Perception: Attention normal.        Mood and Affect: Mood normal.    Wt Readings from Last 3 Encounters:  04/22/21 208 lb 12.8 oz (94.7 kg)  02/11/21 207 lb (93.9 kg)  12/14/20 206 lb (93.4 kg)    BP 132/68   Pulse 88   Ht 5\' 5"  (1.651 m)   Wt 208 lb 12.8 oz (94.7 kg)   SpO2 95%   BMI 34.75 kg/m   Assessment and Plan: 1. Annual physical exam Exam is normal except for weight. Encourage regular exercise and appropriate dietary changes. Up to date on screenings and immunizations. She will call GI to schedule her colonoscopy that was due to be repeated in 09/2020 with Dr. Allen Norris. - Comprehensive metabolic panel - TSH  2. Encounter for screening mammogram for breast cancer Schedule in May - MM 3D SCREEN BREAST BILATERAL  3. Hyperlipidemia associated with type 2 diabetes mellitus ([place]) Tolerating statin medication without side effects at this time LDL is at goal of < 70 on current dose Continue same therapy without change at this time. - Lipid panel  4. Type II diabetes mellitus with complication ([facility]) Diet only at this time - will check labs and advise; begin regular exercise program as tolerated - Comprehensive metabolic panel - Hemoglobin A1c  5. Encounter for screening for osteoporosis Schedule in May - DG Bone  Density  6. Centrilobular emphysema ([place]) Followed by Dr. Raul Del Much improved on Anoro  daily  7. Hx of peptic ulcer Continue Pepcid daily - CBC with Differential/Platelet  8. Current tobacco use Not interested in quitting at this time Participating in the LDCT screening program  9. Need for vaccination for pneumococcus - Pneumococcal conjugate vaccine 20-valent  10. Aortic atherosclerosis ([place]) Continue statin   Partially dictated using Editor, commissioning. Any errors are unintentional.  Halina Maidens, MD [facility]  04/22/2021

## 2021-04-22 NOTE — Patient Instructions (Signed)
Phone: [phone] Fax: [phone]   Dr Allen Norris - GI

## 2021-04-23 LAB — LIPID PANEL
Chol/HDL Ratio: 3.1 ratio (ref 0.0–4.4)
Cholesterol, Total: 119 mg/dL (ref 100–199)
HDL: 39 mg/dL — ABNORMAL LOW (ref >39)
LDL Chol Calc (NIH): 58 mg/dL (ref 0–99)
Triglycerides: 123 mg/dL (ref 0–149)
VLDL Cholesterol Cal: 22 mg/dL (ref 5–40)

## 2021-04-23 LAB — CBC WITH DIFFERENTIAL/PLATELET
Basophils Absolute: 0.1 10*3/uL (ref 0.0–0.2)
Basos: 1 %
EOS (ABSOLUTE): 0.9 10*3/uL — ABNORMAL HIGH (ref 0.0–0.4)
Eos: 8 %
Hematocrit: 45.6 % (ref 34.0–46.6)
Hemoglobin: 15.1 g/dL (ref 11.1–15.9)
Immature Grans (Abs): 0.1 10*3/uL (ref 0.0–0.1)
Immature Granulocytes: 1 %
Lymphocytes Absolute: 2.6 10*3/uL (ref 0.7–3.1)
Lymphs: 22 %
MCH: 28.2 pg (ref 26.6–33.0)
MCHC: 33.1 g/dL (ref 31.5–35.7)
MCV: 85 fL (ref 79–97)
Monocytes Absolute: 0.9 10*3/uL (ref 0.1–0.9)
Monocytes: 7 %
Neutrophils Absolute: 7.4 10*3/uL — ABNORMAL HIGH (ref 1.4–7.0)
Neutrophils: 61 %
Platelets: 250 10*3/uL (ref 150–450)
RBC: 5.35 x10E6/uL — ABNORMAL HIGH (ref 3.77–5.28)
RDW: 13.5 % (ref 11.7–15.4)
WBC: 11.9 10*3/uL — ABNORMAL HIGH (ref 3.4–10.8)

## 2021-04-23 LAB — HEMOGLOBIN A1C
Est. average glucose Bld gHb Est-mCnc: 154 mg/dL
Hgb A1c MFr Bld: 7 % — ABNORMAL HIGH (ref 4.8–5.6)

## 2021-04-23 LAB — COMPREHENSIVE METABOLIC PANEL
ALT: 16 IU/L (ref 0–32)
AST: 17 IU/L (ref 0–40)
Albumin/Globulin Ratio: 1.9 (ref 1.2–2.2)
Albumin: 4.4 g/dL (ref 3.8–4.8)
Alkaline Phosphatase: 93 IU/L (ref 44–121)
BUN/Creatinine Ratio: 14 (ref 12–28)
BUN: 14 mg/dL (ref 8–27)
Bilirubin Total: 0.4 mg/dL (ref 0.0–1.2)
CO2: 22 mmol/L (ref 20–29)
Calcium: 9.6 mg/dL (ref 8.7–10.3)
Chloride: 103 mmol/L (ref 96–106)
Creatinine, Ser: 1.02 mg/dL — ABNORMAL HIGH (ref 0.57–1.00)
Globulin, Total: 2.3 g/dL (ref 1.5–4.5)
Glucose: 102 mg/dL — ABNORMAL HIGH (ref 70–99)
Potassium: 4.6 mmol/L (ref 3.5–5.2)
Sodium: 140 mmol/L (ref 134–144)
Total Protein: 6.7 g/dL (ref 6.0–8.5)
eGFR: 61 mL/min/{1.73_m2} (ref >59)

## 2021-04-23 LAB — TSH: TSH: 1.21 u[IU]/mL (ref 0.450–4.500)

## 2021-05-24 ENCOUNTER — Telehealth: Payer: Self-pay

## 2021-05-24 NOTE — Telephone Encounter (Signed)
Called pt as a reminder to call to schedule mammogram. VM not set up.  [facility] nurse may give results to patient if they return call to clinic, a CRM has been created.  KP

## 2021-05-25 ENCOUNTER — Telehealth: Payer: Self-pay

## 2021-05-25 NOTE — Telephone Encounter (Signed)
Call to patient cell number- patient identified number on recording- left message time to schedule MM please cal to schedule- let office know if she needs assistance

## 2021-05-25 NOTE — Telephone Encounter (Signed)
Called pt as a reminder to get bone density done. [phone]  [facility] nurse may give results to patient if they return call to clinic, a CRM has been created.  KP

## 2021-06-30 ENCOUNTER — Other Ambulatory Visit: Payer: Self-pay

## 2021-06-30 ENCOUNTER — Ambulatory Visit
Admission: RE | Admit: 2021-06-30 | Discharge: 2021-06-30 | Disposition: A | Discharge status: Home / Self Care | Payer: 59 | Source: Ambulatory Visit | Attending: Internal Medicine | Admitting: Internal Medicine

## 2021-06-30 DIAGNOSIS — Z13820 Encounter for screening for osteoporosis: Secondary | ICD-10-CM | POA: Insufficient documentation

## 2021-07-10 ENCOUNTER — Other Ambulatory Visit: Payer: Self-pay | Admitting: Internal Medicine

## 2021-07-10 DIAGNOSIS — I700 Atherosclerosis of aorta: Secondary | ICD-10-CM

## 2021-07-11 NOTE — Telephone Encounter (Signed)
Requested medication (s) are due for refill today: Yes  Requested medication (s) are on the active medication list: Yes  Last refill:  06/01/20  Future visit scheduled: Yes  Notes to clinic:  Prescription expired.    Requested Prescriptions  Pending Prescriptions Disp Refills   atorvastatin (LIPITOR) 10 MG tablet [Pharmacy Med Name: Atorvastatin Calcium 10 MG Oral Tablet] 90 tablet 0    Sig: Take 1 tablet by mouth once daily     Cardiovascular:  Antilipid - Statins Failed - 07/10/2021  2:07 PM      Failed - HDL in normal range and within 360 days    HDL  Date Value Ref Range Status  04/22/2021 39 (L) >39 mg/dL Final          Passed - Total Cholesterol in normal range and within 360 days    Cholesterol, Total  Date Value Ref Range Status  04/22/2021 119 100 - 199 mg/dL Final          Passed - LDL in normal range and within 360 days    LDL Chol Calc (NIH)  Date Value Ref Range Status  04/22/2021 58 0 - 99 mg/dL Final          Passed - Triglycerides in normal range and within 360 days    Triglycerides  Date Value Ref Range Status  04/22/2021 123 0 - 149 mg/dL Final          Passed - Patient is not pregnant      Passed - Valid encounter within last 12 months    Recent Outpatient Visits           2 months ago Annual physical exam   [facility] Glean Hess, MD   5 months ago Left genital labial abscess   [facility]   6 months ago Type II diabetes mellitus with complication [facility])   [facility] Glean Hess, MD   10 months ago Type II diabetes mellitus with complication [facility])   [facility] Glean Hess, MD   1 year ago Acute non-recurrent maxillary sinusitis   [facility] Glean Hess, MD       Future Appointments             In 3 months Army Melia Jesse Sans, MD [facility]

## 2021-10-16 ENCOUNTER — Other Ambulatory Visit: Payer: Self-pay | Admitting: Internal Medicine

## 2021-10-16 DIAGNOSIS — Z720 Tobacco use: Secondary | ICD-10-CM

## 2021-10-18 NOTE — Telephone Encounter (Signed)
Requested Prescriptions  ?Pending Prescriptions Disp Refills  ?? albuterol (VENTOLIN HFA) 108 (90 Base) MCG/ACT inhaler [Pharmacy Med Name: Albuterol Sulfate HFA 108 (90 Base) MCG/ACT Inhalation Aerosol Solution] 18 g 0  ?  Sig: INHALE 1 TO 2 PUFFS BY MOUTH EVERY 6 HOURS AS NEEDED FOR WHEEZING FOR SHORTNESS OF BREATH  ?  ? Pulmonology:  Beta Agonists 2 Passed - 10/16/2021  8:57 PM  ?  ?  Passed - Last BP in normal range  ?  BP Readings from Last 1 Encounters:  ?04/22/21 124/68  ?   ?  ?  Passed - Last Heart Rate in normal range  ?  Pulse Readings from Last 1 Encounters:  ?04/22/21 88  ?   ?  ?  Passed - Valid encounter within last 12 months  ?  Recent Outpatient Visits   ?      ? 5 months ago Annual physical exam  ? [facility] Glean Hess, MD  ? 8 months ago Left genital labial abscess  ? [facility] Glean Hess, MD  ? 10 months ago Type II diabetes mellitus with complication [facility])  ? [facility] Glean Hess, MD  ? 1 year ago Type II diabetes mellitus with complication [facility])  ? [facility] Glean Hess, MD  ? 1 year ago Acute non-recurrent maxillary sinusitis  ? [facility] Glean Hess, MD  ?  ?  ?Future Appointments   ?        ? In 2 days Glean Hess, MD [facility] [place]  ?  ? ?  ?  ?  ? ? ?

## 2021-10-20 ENCOUNTER — Encounter: Payer: Self-pay | Admitting: Internal Medicine

## 2021-10-20 ENCOUNTER — Ambulatory Visit (INDEPENDENT_AMBULATORY_CARE_PROVIDER_SITE_OTHER): Payer: 59 | Admitting: Internal Medicine

## 2021-10-20 VITALS — BP 128/70 | HR 85 | Resp 96 | Ht 65.0 in | Wt 210.0 lb

## 2021-10-20 DIAGNOSIS — K219 Gastro-esophageal reflux disease without esophagitis: Secondary | ICD-10-CM | POA: Diagnosis not present

## 2021-10-20 DIAGNOSIS — J432 Centrilobular emphysema: Secondary | ICD-10-CM

## 2021-10-20 DIAGNOSIS — E118 Type 2 diabetes mellitus with unspecified complications: Secondary | ICD-10-CM

## 2021-10-20 DIAGNOSIS — I700 Atherosclerosis of aorta: Secondary | ICD-10-CM | POA: Diagnosis not present

## 2021-10-20 NOTE — Progress Notes (Signed)
? ? ?Date:  10/20/2021  ? ?Name:  Diamond Jackson   DOB:  [birth year]   MRN:  [id number] ? ? ?Chief Complaint: Diabetes ? ?Diabetes ?She presents for her follow-up diabetic visit. She has type 2 diabetes mellitus. Her disease course has been stable. Pertinent negatives for hypoglycemia include no headaches, nervousness/anxiousness or tremors. Pertinent negatives for diabetes include no chest pain, no fatigue, no polydipsia and no polyuria. Current diabetic treatment includes diet. She participates in exercise three times a week. An ACE inhibitor/angiotensin II receptor blocker is not being taken.  ? ?Lab Results  ?Component Value Date  ? NA 140 04/22/2021  ? K 4.6 04/22/2021  ? CO2 22 04/22/2021  ? GLUCOSE 102 (H) 04/22/2021  ? BUN 14 04/22/2021  ? CREATININE 1.02 (H) 04/22/2021  ? CALCIUM 9.6 04/22/2021  ? EGFR 61 04/22/2021  ? GFRNONAA 68 04/20/2020  ? ?Lab Results  ?Component Value Date  ? CHOL 119 04/22/2021  ? HDL 39 (L) 04/22/2021  ? [place] 58 04/22/2021  ? TRIG 123 04/22/2021  ? CHOLHDL 3.1 04/22/2021  ? ?Lab Results  ?Component Value Date  ? TSH 1.210 04/22/2021  ? ?Lab Results  ?Component Value Date  ? HGBA1C 7.0 (H) 04/22/2021  ? ?Lab Results  ?Component Value Date  ? WBC 11.9 (H) 04/22/2021  ? HGB 15.1 04/22/2021  ? HCT 45.6 04/22/2021  ? MCV 85 04/22/2021  ? PLT 250 04/22/2021  ? ?Lab Results  ?Component Value Date  ? ALT 16 04/22/2021  ? AST 17 04/22/2021  ? ALKPHOS 93 04/22/2021  ? BILITOT 0.4 04/22/2021  ? ?No results found for: 25OHVITD2, [place], VD25OH  ? ?Review of Systems  ?Constitutional:  Negative for appetite change, fatigue, fever and unexpected weight change.  ?HENT:  Negative for tinnitus and trouble swallowing.   ?Eyes:  Negative for visual disturbance.  ?Respiratory:  Negative for cough, chest tightness and shortness of breath.   ?Cardiovascular:  Negative for chest pain, palpitations and leg swelling.  ?Gastrointestinal:  Negative for abdominal pain.  ?Endocrine: Negative for polydipsia  and polyuria.  ?Genitourinary:  Negative for dysuria and hematuria.  ?Musculoskeletal:  Negative for arthralgias.  ?Neurological:  Negative for tremors, numbness and headaches.  ?Psychiatric/Behavioral:  Negative for dysphoric mood and sleep disturbance. The patient is not nervous/anxious.   ? ?Patient Active Problem List  ? Diagnosis Date Noted  ? Type II diabetes mellitus with complication ([place]) 59/29/2446  ? Coronary artery calcification seen on computed tomography 01/02/2021  ? Centrilobular emphysema ([place]) 06/01/2020  ? Aortic atherosclerosis ([place]) 06/01/2020  ? Personal history of colonic polyps   ? Polyp of transverse colon   ? Benign neoplasm of cecum   ? Benign neoplasm of transverse colon   ? Benign neoplasm of rectosigmoid junction   ? Environmental and seasonal allergies 03/22/2015  ? Fibrocystic breast changes 03/22/2015  ? Hx of peptic ulcer 03/22/2015  ? Current tobacco use 03/22/2015  ? ? ?No Known Allergies ? ?Past Surgical History:  ?Procedure Laterality Date  ? ANKLE SURGERY Right 1984  ? BREAST BIOPSY Left   ? benign two areas/clips  ? CHOLECYSTECTOMY  2010  ? CLEFT PALATE REPAIR    ? as an infant  ? COLONOSCOPY  2010  ? normal  ? COLONOSCOPY WITH PROPOFOL N/A 05/17/2018  ? Procedure: COLONOSCOPY WITH BIOPSIES;  Surgeon: [name], MD;  Location: [place];  Service: Endoscopy;  Laterality: N/A;  ? COLONOSCOPY WITH PROPOFOL N/A 09/29/2019  ? Procedure: COLONOSCOPY WITH  BIOPSY;  Surgeon: [name], MD;  Location: [place];  Service: Endoscopy;  Laterality: N/A;  priority 3  ? POLYPECTOMY  05/17/2018  ? Procedure: POLYPECTOMY;  Surgeon: [name], MD;  Location: [place];  Service: Endoscopy;;  ? POLYPECTOMY N/A 09/29/2019  ? Procedure: POLYPECTOMY;  Surgeon: [name], MD;  Location: [place];  Service: Endoscopy;  Laterality: N/A;  Clips x 2 placed at site of Hepatic Flexure Polyp site removal  ? ? ?Social History  ? ?Tobacco Use  ? Smoking  status: Every Day  ?  Packs/day: 1.50  ?  Years: 48.00  ?  Pack years: 72.00  ?  Types: Cigarettes  ? Smokeless tobacco: Never  ? Tobacco comments:  ?  0.75ppd currently  ?Vaping Use  ? Vaping Use: Never used  ?Substance Use Topics  ? Alcohol use: No  ?  Alcohol/week: 0.0 standard drinks  ? Drug use: No  ? ? ? ?Medication list has been reviewed and updated. ? ?Current Meds  ?Medication Sig  ? albuterol (VENTOLIN HFA) 108 (90 Base) MCG/ACT inhaler INHALE 1 TO 2 PUFFS BY MOUTH EVERY 6 HOURS AS NEEDED FOR WHEEZING FOR SHORTNESS OF BREATH  ? ANORO ELLIPTA 62.5-25 MCG/ACT AEPB 1 puff daily.  ? atorvastatin (LIPITOR) 10 MG tablet Take 1 tablet (10 mg total) by mouth daily.  ? cetirizine (ZYRTEC) 10 MG tablet Take 10 mg by mouth daily as needed for allergies.  ? Levocetirizine Dihydrochloride (XYZAL PO) Take by mouth as needed.  ? montelukast (SINGULAIR) 10 MG tablet Take 1 tablet (10 mg total) by mouth at bedtime.  ? OPCON-A 0.027-0.315 % SOLN For eyes  ? pantoprazole (PROTONIX) 40 MG tablet Take 40 mg by mouth every morning.  ? ? ? ?  10/20/2021  ?  9:55 AM 04/22/2021  ?  9:32 AM 02/11/2021  ?  3:49 PM 12/14/2020  ?  8:17 AM  ?GAD 7 : Generalized Anxiety Score  ?Nervous, Anxious, on Edge 1 0 0 0  ?Control/stop worrying 1 0 0 0  ?Worry too much - different things 0 0 0 0  ?Trouble relaxing 0 0 0 0  ?Restless 1 0 0 0  ?Easily annoyed or irritable 1 0 0 0  ?Afraid - awful might happen 0 0 0 0  ?Total GAD 7 Score 4 0 0 0  ?Anxiety Difficulty Not difficult at all Not difficult at all Not difficult at all Not difficult at all  ? ? ? ?  10/20/2021  ?  9:54 AM  ?Depression screen PHQ 2/9  ?Decreased Interest 1  ?Down, Depressed, Hopeless 1  ?PHQ - 2 Score 2  ?Altered sleeping 0  ?Tired, decreased energy 1  ?Change in appetite 2  ?Feeling bad or failure about yourself  0  ?Trouble concentrating 0  ?Moving slowly or fidgety/restless 0  ?Suicidal thoughts 0  ?PHQ-9 Score 5  ?Difficult doing work/chores Not difficult at all  ? ? ?BP  Readings from Last 3 Encounters:  ?10/20/21 128/70  ?04/22/21 124/68  ?02/11/21 122/70  ? ? ?Physical Exam ?Vitals and nursing note reviewed.  ?Constitutional:   ?   General: She is not in acute distress. ?   Appearance: Normal appearance. She is well-developed.  ?HENT:  ?   Head: Normocephalic and atraumatic.  ?Cardiovascular:  ?   Rate and Rhythm: Normal rate and regular rhythm.  ?   Pulses: Normal pulses.  ?   Heart sounds: No murmur heard. ?Pulmonary:  ?  Effort: Pulmonary effort is normal. No respiratory distress.  ?   Breath sounds: Examination of the right-lower field reveals wheezing. Wheezing present. No rhonchi.  ?Musculoskeletal:  ?   Cervical back: Normal range of motion.  ?   Right lower leg: No edema.  ?   Left lower leg: No edema.  ?Lymphadenopathy:  ?   Cervical: No cervical adenopathy.  ?Skin: ?   General: Skin is warm and dry.  ?   Findings: No rash.  ?Neurological:  ?   General: No focal deficit present.  ?   Mental Status: She is alert and oriented to person, place, and time.  ?Psychiatric:     ?   Mood and Affect: Mood normal.     ?   Behavior: Behavior normal.  ? ? ?Wt Readings from Last 3 Encounters:  ?10/20/21 210 lb (95.3 kg)  ?04/22/21 208 lb 12.8 oz (94.7 kg)  ?02/11/21 207 lb (93.9 kg)  ? ? ?BP 128/70   Pulse 85   Resp (!) 96   Ht 5' 5"  (1.651 m)   Wt 210 lb (95.3 kg)   BMI 34.95 kg/m?  ? ?Assessment and Plan: ?1. Type II diabetes mellitus with complication ([place]) ?BS have been controlled with diet alone - discussed need to work on weight loss and will start medications if A1C continues to increase. ?May also need ACE if microalbuminuria is present. ?Encourage yearly eye exam. ?- Microalbumin / creatinine urine ratio ?- Hemoglobin C6O ?- Basic metabolic panel ? ?2. LPRD (laryngopharyngeal reflux disease) ?Seen by ENT and felt to be contributing to asthma/COPD symptoms as well and sinus congestion. ?Taking PPI daily with improvement ? ?3. Centrilobular emphysema ([place]) ?On daily Anoro  with marked improvement. ? ?4. Aortic atherosclerosis ([place]) ?On daily Statin therapy with LDL < 70 ? ? ?Partially dictated using Editor, commissioning. Any errors are unintentional. ? ?Halina Maidens, MD ?[facility]

## 2021-10-21 LAB — BASIC METABOLIC PANEL
BUN/Creatinine Ratio: 13 (ref 12–28)
BUN: 12 mg/dL (ref 8–27)
CO2: 18 mmol/L — ABNORMAL LOW (ref 20–29)
Calcium: 9.7 mg/dL (ref 8.7–10.3)
Chloride: 102 mmol/L (ref 96–106)
Creatinine, Ser: 0.96 mg/dL (ref 0.57–1.00)
Glucose: 114 mg/dL — ABNORMAL HIGH (ref 70–99)
Potassium: 4.4 mmol/L (ref 3.5–5.2)
Sodium: 140 mmol/L (ref 134–144)
eGFR: 66 mL/min/{1.73_m2} (ref >59)

## 2021-10-21 LAB — HEMOGLOBIN A1C
Est. average glucose Bld gHb Est-mCnc: 157 mg/dL
Hgb A1c MFr Bld: 7.1 % — ABNORMAL HIGH (ref 4.8–5.6)

## 2021-10-21 LAB — MICROALBUMIN / CREATININE URINE RATIO
Creatinine, Urine: 46 mg/dL
Microalb/Creat Ratio: <7 mg/g creat (ref 0–29)
Microalbumin, Urine: <3 ug/mL

## 2021-11-17 ENCOUNTER — Telehealth: Payer: Self-pay | Admitting: Acute Care

## 2021-11-17 NOTE — Telephone Encounter (Signed)
She is due in July.  She was on a Dec schedule but when we did the follow up scan it changed her to July.

## 2021-12-12 ENCOUNTER — Telehealth: Payer: Self-pay | Admitting: Acute Care

## 2021-12-12 NOTE — Telephone Encounter (Signed)
Spoke with the patient by phone to confirm the 12/28/21 appt for 430pm at [facility] - no prep/arrive 10-15 minutes early. Patient acknowledged understanding.  She thought scan done in July with repeat in Dec when it was actually the other way around.  She agrees to July 2023 scan.

## 2021-12-28 ENCOUNTER — Ambulatory Visit
Admission: RE | Admit: 2021-12-28 | Discharge: 2021-12-28 | Disposition: A | Discharge status: Home / Self Care | Payer: 59 | Source: Ambulatory Visit | Attending: Acute Care | Admitting: Acute Care

## 2021-12-28 DIAGNOSIS — Z720 Tobacco use: Secondary | ICD-10-CM | POA: Diagnosis present

## 2021-12-28 DIAGNOSIS — Z122 Encounter for screening for malignant neoplasm of respiratory organs: Secondary | ICD-10-CM | POA: Diagnosis not present

## 2021-12-28 DIAGNOSIS — I2510 Atherosclerotic heart disease of native coronary artery without angina pectoris: Secondary | ICD-10-CM | POA: Insufficient documentation

## 2021-12-28 DIAGNOSIS — Z87891 Personal history of nicotine dependence: Secondary | ICD-10-CM

## 2021-12-28 DIAGNOSIS — F17210 Nicotine dependence, cigarettes, uncomplicated: Secondary | ICD-10-CM | POA: Insufficient documentation

## 2021-12-28 DIAGNOSIS — I700 Atherosclerosis of aorta: Secondary | ICD-10-CM | POA: Diagnosis not present

## 2021-12-28 DIAGNOSIS — J432 Centrilobular emphysema: Secondary | ICD-10-CM | POA: Insufficient documentation

## 2022-01-06 ENCOUNTER — Other Ambulatory Visit: Payer: Self-pay

## 2022-01-06 ENCOUNTER — Other Ambulatory Visit: Payer: Self-pay | Admitting: Internal Medicine

## 2022-01-06 DIAGNOSIS — I700 Atherosclerosis of aorta: Secondary | ICD-10-CM

## 2022-01-06 DIAGNOSIS — Z87891 Personal history of nicotine dependence: Secondary | ICD-10-CM

## 2022-01-06 DIAGNOSIS — Z122 Encounter for screening for malignant neoplasm of respiratory organs: Secondary | ICD-10-CM

## 2022-01-06 DIAGNOSIS — F17210 Nicotine dependence, cigarettes, uncomplicated: Secondary | ICD-10-CM

## 2022-01-06 NOTE — Telephone Encounter (Signed)
Requested Prescriptions  Pending Prescriptions Disp Refills  . atorvastatin (LIPITOR) 10 MG tablet [Pharmacy Med Name: Atorvastatin Calcium 10 MG Oral Tablet] 90 tablet 1    Sig: Take 1 tablet by mouth once daily     Cardiovascular:  Antilipid - Statins Failed - 01/06/2022 11:21 AM      Failed - Lipid Panel in normal range within the last 12 months    Cholesterol, Total  Date Value Ref Range Status  04/22/2021 119 100 - 199 mg/dL Final   LDL Chol Calc (NIH)  Date Value Ref Range Status  04/22/2021 58 0 - 99 mg/dL Final   HDL  Date Value Ref Range Status  04/22/2021 39 (L) >39 mg/dL Final   Triglycerides  Date Value Ref Range Status  04/22/2021 123 0 - 149 mg/dL Final         Passed - Patient is not pregnant      Passed - Valid encounter within last 12 months    Recent Outpatient Visits          2 months ago Type II diabetes mellitus with complication [facility])   [facility] Glean Hess, MD   8 months ago Annual physical exam   [facility] Glean Hess, MD   10 months ago Left genital labial abscess   [facility] Glean Hess, MD   1 year ago Type II diabetes mellitus with complication [facility])   [facility] Glean Hess, MD   1 year ago Type II diabetes mellitus with complication [facility])   [facility] Glean Hess, MD

## 2022-02-18 ENCOUNTER — Other Ambulatory Visit: Payer: Self-pay | Admitting: Internal Medicine

## 2022-02-18 DIAGNOSIS — Z720 Tobacco use: Secondary | ICD-10-CM

## 2022-02-21 NOTE — Telephone Encounter (Signed)
Requested Prescriptions  Pending Prescriptions Disp Refills  . albuterol (VENTOLIN HFA) 108 (90 Base) MCG/ACT inhaler [Pharmacy Med Name: Albuterol Sulfate HFA 108 (90 Base) MCG/ACT Inhalation Aerosol Solution] 18 each 0    Sig: INHALE 1 TO 2 PUFFS BY MOUTH EVERY 6 HOURS AS NEEDED FOR WHEEZING FOR SHORTNESS OF BREATH     Pulmonology:  Beta Agonists 2 Passed - 02/18/2022  7:16 PM      Passed - Last BP in normal range    BP Readings from Last 1 Encounters:  10/20/21 128/70         Passed - Last Heart Rate in normal range    Pulse Readings from Last 1 Encounters:  10/20/21 85         Passed - Valid encounter within last 12 months    Recent Outpatient Visits          4 months ago Type II diabetes mellitus with complication ([place])   [place] [facility], Jesse Sans, MD   10 months ago Annual physical exam   [place] [facility] at [facility], Jesse Sans, MD   1 year ago Left genital labial abscess   [place] [facility] at [facility], Jesse Sans, MD   1 year ago Type II diabetes mellitus with complication [facility])   [place] [facility] at [facility], Jesse Sans, MD   1 year ago Type II diabetes mellitus with complication [facility])   [place] [facility] at [facility], Jesse Sans, MD

## 2022-05-01 ENCOUNTER — Other Ambulatory Visit: Payer: Self-pay | Admitting: Internal Medicine

## 2022-05-01 DIAGNOSIS — Z1231 Encounter for screening mammogram for malignant neoplasm of breast: Secondary | ICD-10-CM

## 2022-05-10 ENCOUNTER — Ambulatory Visit
Admission: RE | Admit: 2022-05-10 | Discharge: 2022-05-10 | Disposition: A | Discharge status: Home / Self Care | Payer: 59 | Source: Ambulatory Visit | Attending: Internal Medicine | Admitting: Internal Medicine

## 2022-05-10 DIAGNOSIS — Z1231 Encounter for screening mammogram for malignant neoplasm of breast: Secondary | ICD-10-CM | POA: Insufficient documentation

## 2022-05-22 LAB — OPHTHALMOLOGY REPORT-SCANNED

## 2022-06-06 ENCOUNTER — Other Ambulatory Visit: Payer: Self-pay | Admitting: Internal Medicine

## 2022-06-06 DIAGNOSIS — Z720 Tobacco use: Secondary | ICD-10-CM

## 2022-06-29 DIAGNOSIS — J449 Chronic obstructive pulmonary disease, unspecified: Secondary | ICD-10-CM | POA: Diagnosis not present

## 2022-06-29 DIAGNOSIS — F17210 Nicotine dependence, cigarettes, uncomplicated: Secondary | ICD-10-CM | POA: Diagnosis not present

## 2022-06-29 DIAGNOSIS — J208 Acute bronchitis due to other specified organisms: Secondary | ICD-10-CM | POA: Diagnosis not present

## 2022-06-29 DIAGNOSIS — K219 Gastro-esophageal reflux disease without esophagitis: Secondary | ICD-10-CM | POA: Diagnosis not present

## 2022-08-25 ENCOUNTER — Other Ambulatory Visit: Payer: Self-pay | Admitting: Internal Medicine

## 2022-08-25 DIAGNOSIS — I700 Atherosclerosis of aorta: Secondary | ICD-10-CM

## 2022-08-25 NOTE — Telephone Encounter (Signed)
Requested medication (s) are due for refill today: yes  Requested medication (s) are on the active medication list: yes  Last refill:  01/06/22 #90/1  Future visit scheduled: yes  Notes to clinic:  Unable to refill per protocol due to failed labs, no updated results.      Requested Prescriptions  Pending Prescriptions Disp Refills   atorvastatin (LIPITOR) 10 MG tablet [Pharmacy Med Name: Atorvastatin Calcium 10 MG Oral Tablet] 90 tablet 0    Sig: Take 1 tablet by mouth once daily     Cardiovascular:  Antilipid - Statins Failed - 08/25/2022  5:28 PM      Failed - Lipid Panel in normal range within the last 12 months    Cholesterol, Total  Date Value Ref Range Status  04/22/2021 119 100 - 199 mg/dL Final   LDL Chol Calc (NIH)  Date Value Ref Range Status  04/22/2021 58 0 - 99 mg/dL Final   HDL  Date Value Ref Range Status  04/22/2021 39 (L) >39 mg/dL Final   Triglycerides  Date Value Ref Range Status  04/22/2021 123 0 - 149 mg/dL Final         Passed - Patient is not pregnant      Passed - Valid encounter within last 12 months    Recent Outpatient Visits           10 months ago Type II diabetes mellitus with complication ([place])   [place] [facility] [place], Jesse Sans, MD   1 year ago Annual physical exam   [facility] at [facility], Jesse Sans, MD   1 year ago Left genital labial abscess   [place] [facility] at [facility], Jesse Sans, MD   1 year ago Type II diabetes mellitus with complication [facility])   [place] [facility] at [facility], Jesse Sans, MD   2 years ago Type II diabetes mellitus with complication [facility])   [place] [facility], Jesse Sans, MD       Future Appointments             In 5 days [facility] Melia, Jesse Sans, MD [place] at [facility], [facility]

## 2022-08-30 ENCOUNTER — Telehealth: Payer: Self-pay

## 2022-08-30 ENCOUNTER — Other Ambulatory Visit: Payer: Self-pay

## 2022-08-30 ENCOUNTER — Encounter: Payer: Self-pay | Admitting: Internal Medicine

## 2022-08-30 ENCOUNTER — Ambulatory Visit (INDEPENDENT_AMBULATORY_CARE_PROVIDER_SITE_OTHER): Payer: Medicare Other | Admitting: Internal Medicine

## 2022-08-30 VITALS — BP 112/66 | HR 85 | Ht 65.0 in | Wt 209.4 lb

## 2022-08-30 DIAGNOSIS — E118 Type 2 diabetes mellitus with unspecified complications: Secondary | ICD-10-CM | POA: Diagnosis not present

## 2022-08-30 DIAGNOSIS — Z86010 Personal history of colonic polyps: Secondary | ICD-10-CM | POA: Diagnosis not present

## 2022-08-30 DIAGNOSIS — E1169 Type 2 diabetes mellitus with other specified complication: Secondary | ICD-10-CM

## 2022-08-30 DIAGNOSIS — I700 Atherosclerosis of aorta: Secondary | ICD-10-CM

## 2022-08-30 DIAGNOSIS — Z0000 Encounter for general adult medical examination without abnormal findings: Secondary | ICD-10-CM | POA: Diagnosis not present

## 2022-08-30 DIAGNOSIS — Z1231 Encounter for screening mammogram for malignant neoplasm of breast: Secondary | ICD-10-CM

## 2022-08-30 DIAGNOSIS — J432 Centrilobular emphysema: Secondary | ICD-10-CM

## 2022-08-30 DIAGNOSIS — Z720 Tobacco use: Secondary | ICD-10-CM

## 2022-08-30 DIAGNOSIS — M25562 Pain in left knee: Secondary | ICD-10-CM

## 2022-08-30 DIAGNOSIS — E785 Hyperlipidemia, unspecified: Secondary | ICD-10-CM

## 2022-08-30 MED ORDER — PANTOPRAZOLE SODIUM 40 MG PO TBEC
40.0000 mg | DELAYED_RELEASE_TABLET | Freq: Every morning | ORAL | 1 refills | Status: DC
Start: 2022-08-30 — End: 2024-01-22

## 2022-08-30 MED ORDER — ATORVASTATIN CALCIUM 10 MG PO TABS: 10.0000 mg | ORAL_TABLET | Freq: Every day | ORAL | 1 refills | Status: DC

## 2022-08-30 MED ORDER — NA SULFATE-K SULFATE-MG SULF 17.5-3.13-1.6 GM/177ML PO SOLN: 1.0000 | Freq: Once | ORAL | 0 refills | Status: AC

## 2022-08-30 NOTE — Telephone Encounter (Signed)
Gastroenterology Pre-Procedure Review  Request Date: 10/09/22 Requesting Physician: Dr. Allen Norris  PATIENT REVIEW QUESTIONS: The patient responded to the following health history questions as indicated:    1. Are you having any GI issues? no 2. Do you have a personal history of Polyps? yes (last colonoscopy performed by dr. Allen Norris on 09/29/19 polyps were noted) 3. Do you have a family history of Colon Cancer or Polyps? no 4. Diabetes Mellitus? no 5. Joint replacements in the past 12 months?no 6. Major health problems in the past 3 months?no 7. Any artificial heart valves, MVP, or defibrillator?no    MEDICATIONS & ALLERGIES:    Patient reports the following regarding taking any anticoagulation/antiplatelet therapy:   Plavix, Coumadin, Eliquis, Xarelto, Lovenox, Pradaxa, Brilinta, or Effient? no Aspirin? no  Patient confirms/reports the following medications:  Current Outpatient Medications  Medication Sig Dispense Refill   albuterol (VENTOLIN HFA) 108 (90 Base) MCG/ACT inhaler INHALE 1 TO 2 PUFFS BY MOUTH EVERY 6 HOURS AS NEEDED FOR WHEEZING FOR SHORTNESS OF BREATH 18 g 3   ANORO ELLIPTA 62.5-25 MCG/ACT AEPB 1 puff daily.     atorvastatin (LIPITOR) 10 MG tablet Take 1 tablet (10 mg total) by mouth daily. 90 tablet 1   cetirizine (ZYRTEC) 10 MG tablet Take 10 mg by mouth daily as needed for allergies.     Levocetirizine Dihydrochloride (XYZAL PO) Take by mouth as needed.     montelukast (SINGULAIR) 10 MG tablet Take 1 tablet (10 mg total) by mouth at bedtime. 90 tablet 3   OPCON-A 0.027-0.315 % SOLN For eyes     pantoprazole (PROTONIX) 40 MG tablet Take 1 tablet (40 mg total) by mouth every morning. 90 tablet 1   No current facility-administered medications for this visit.    Patient confirms/reports the following allergies:  No Known Allergies  No orders of the defined types were placed in this encounter.   AUTHORIZATION INFORMATION Primary Insurance: 1D#: Group #:  Secondary  Insurance: 1D#: Group #:  SCHEDULE INFORMATION: Date: 10/09/22 Time: Location: [facility]

## 2022-08-30 NOTE — Assessment & Plan Note (Signed)
Ongoing tobacco use with some intermittent wheezing Participating in lung cancer screening

## 2022-08-30 NOTE — Assessment & Plan Note (Addendum)
Clinically stable without s/s of hypoglycemia. On diet only at this time Lab Results  Component Value Date   HGBA1C 7.1 (H) 10/20/2021  Eye exam due

## 2022-08-30 NOTE — Assessment & Plan Note (Signed)
Tolerating statin medications without concerns LDL is  Lab Results  Component Value Date   LDLCALC 58 04/22/2021

## 2022-08-30 NOTE — Assessment & Plan Note (Signed)
Symptoms controlled with Anoro and PRN albuterol

## 2022-08-30 NOTE — Progress Notes (Signed)
Date:  08/30/2022   Name:  Diamond Jackson   DOB:  1955/09/13   MRN:  [id number]   Chief Complaint: Annual Exam Diamond Jackson is a 67 y.o. female who presents today for her Complete Annual Exam. She feels well. She reports exercising- none. She reports she is sleeping fairly well. Breast complaints none.  Mammogram: 04/2022 DEXA: 2023 osteopenia Pap smear: discontinued Colonoscopy: 09/2019 repeat one yr  Health Maintenance Due  Topic Date Due   Medicare Annual Wellness (AWV)  Never done   OPHTHALMOLOGY EXAM  Never done   COLONOSCOPY (Pts 45-78yrs Insurance coverage will need to be confirmed)  09/28/2020   COVID-19 Vaccine (4 - 2023-24 season) 02/10/2022   HEMOGLOBIN A1C  04/22/2022    Immunization History  Administered Date(s) Administered   Influenza,inj,Quad PF,6+ Mos 04/15/2018, 04/17/2019, 04/20/2020   Influenza-Unspecified 03/30/2017, 04/12/2021, 04/03/2022   PFIZER(Purple Top)SARS-COV-2 Vaccination 09/18/2019, 10/14/2019, 05/27/2020   PNEUMOCOCCAL CONJUGATE-20 04/22/2021   Tdap 04/17/2013   Zoster Recombinat (Shingrix) 05/29/2019, 08/27/2019    Diabetes She presents for her follow-up diabetic visit. She has type 2 diabetes mellitus. Her disease course has been stable. Pertinent negatives for hypoglycemia include no dizziness, headaches, nervousness/anxiousness or tremors. Pertinent negatives for diabetes include no chest pain, no fatigue, no polydipsia and no polyuria. Current diabetic treatment includes diet. An ACE inhibitor/angiotensin II receptor blocker is not being taken. Eye exam is not current.  Hyperlipidemia This is a chronic problem. The problem is controlled. Pertinent negatives include no chest pain or shortness of breath. Current antihyperlipidemic treatment includes statins. The current treatment provides significant improvement of lipids.  Knee Pain  The incident occurred more than 1 week ago. The incident occurred at home. The injury mechanism was a fall and  a twisting injury. The pain is present in the left knee. The quality of the pain is described as aching. The pain is mild. The pain has been Improving since onset. Pertinent negatives include no inability to bear weight, muscle weakness, numbness or tingling.    Lab Results  Component Value Date   NA 140 10/20/2021   K 4.4 10/20/2021   CO2 18 (L) 10/20/2021   GLUCOSE 114 (H) 10/20/2021   BUN 12 10/20/2021   CREATININE 0.96 10/20/2021   CALCIUM 9.7 10/20/2021   EGFR 66 10/20/2021   GFRNONAA 68 04/20/2020   Lab Results  Component Value Date   CHOL 119 04/22/2021   HDL 39 (L) 04/22/2021   LDLCALC 58 04/22/2021   TRIG 123 04/22/2021   CHOLHDL 3.1 04/22/2021   Lab Results  Component Value Date   TSH 1.210 04/22/2021   Lab Results  Component Value Date   HGBA1C 7.1 (H) 10/20/2021   Lab Results  Component Value Date   WBC 11.9 (H) 04/22/2021   HGB 15.1 04/22/2021   HCT 45.6 04/22/2021   MCV 85 04/22/2021   PLT 250 04/22/2021   Lab Results  Component Value Date   ALT 16 04/22/2021   AST 17 04/22/2021   ALKPHOS 93 04/22/2021   BILITOT 0.4 04/22/2021   No results found for: "25OHVITD2", "25OHVITD3", "VD25OH"   Review of Systems  Constitutional:  Negative for chills, fatigue and fever.  HENT:  Negative for congestion, hearing loss, tinnitus, trouble swallowing and voice change.   Eyes:  Negative for visual disturbance.  Respiratory:  Negative for cough, chest tightness, shortness of breath and wheezing.   Cardiovascular:  Negative for chest pain, palpitations and leg swelling.  Gastrointestinal:  Negative  for abdominal pain, constipation, diarrhea and vomiting.  Endocrine: Negative for polydipsia and polyuria.  Genitourinary:  Negative for dysuria, frequency, genital sores, vaginal bleeding and vaginal discharge.  Musculoskeletal:  Positive for arthralgias (left knee pain after a fall at home 2 weeks ago). Negative for gait problem and joint swelling.  Skin:  Negative  for color change and rash.  Neurological:  Negative for dizziness, tingling, tremors, light-headedness, numbness and headaches.  Hematological:  Negative for adenopathy. Does not bruise/bleed easily.  Psychiatric/Behavioral:  Negative for dysphoric mood and sleep disturbance. The patient is not nervous/anxious.     Patient Active Problem List   Diagnosis Date Noted   Hyperlipidemia associated with type 2 diabetes mellitus ([place]) 08/30/2022   Type II diabetes mellitus with complication ([place]) [id number]   Coronary artery calcification seen on computed tomography 01/02/2021   Centrilobular emphysema ([place]) 06/01/2020   Aortic atherosclerosis ([place]) 06/01/2020   Personal history of colonic polyps    Polyp of transverse colon    Benign neoplasm of cecum    Benign neoplasm of transverse colon    Benign neoplasm of rectosigmoid junction    Environmental and seasonal allergies 03/22/2015   Fibrocystic breast changes 03/22/2015   Hx of peptic ulcer 03/22/2015   Current tobacco use 03/22/2015    No Known Allergies  Past Surgical History:  Procedure Laterality Date   ANKLE SURGERY Right 1984   BREAST BIOPSY Left    benign two areas/clips   CHOLECYSTECTOMY  2010   CLEFT PALATE REPAIR     as an infant   COLONOSCOPY  2010   normal   COLONOSCOPY WITH PROPOFOL N/A 05/17/2018   Procedure: COLONOSCOPY WITH BIOPSIES;  Surgeon: [name], MD;  Location: [place];  Service: Endoscopy;  Laterality: N/A;   COLONOSCOPY WITH PROPOFOL N/A 09/29/2019   Procedure: COLONOSCOPY WITH BIOPSY;  Surgeon: [name], MD;  Location: [place];  Service: Endoscopy;  Laterality: N/A;  priority 3   POLYPECTOMY  05/17/2018   Procedure: POLYPECTOMY;  Surgeon: [name], MD;  Location: [place];  Service: Endoscopy;;   POLYPECTOMY N/A 09/29/2019   Procedure: POLYPECTOMY;  Surgeon: [name], MD;  Location: [place];  Service: Endoscopy;  Laterality: N/A;  Clips x 2  placed at site of Hepatic Flexure Polyp site removal    Social History   Tobacco Use   Smoking status: Every Day    Packs/day: 1.50    Years: 48.00    Additional pack years: 0.00    Total pack years: 72.00    Types: Cigarettes   Smokeless tobacco: Never   Tobacco comments:    0.75ppd currently  Vaping Use   Vaping Use: Never used  Substance Use Topics   Alcohol use: No    Alcohol/week: 0.0 standard drinks of alcohol   Drug use: No     Medication list has been reviewed and updated.  Current Meds  Medication Sig   albuterol (VENTOLIN HFA) 108 (90 Base) MCG/ACT inhaler INHALE 1 TO 2 PUFFS BY MOUTH EVERY 6 HOURS AS NEEDED FOR WHEEZING FOR SHORTNESS OF BREATH   ANORO ELLIPTA 62.5-25 MCG/ACT AEPB 1 puff daily.   cetirizine (ZYRTEC) 10 MG tablet Take 10 mg by mouth daily as needed for allergies.   Levocetirizine Dihydrochloride (XYZAL PO) Take by mouth as needed.   montelukast (SINGULAIR) 10 MG tablet Take 1 tablet (10 mg total) by mouth at bedtime.   OPCON-A 0.027-0.315 % SOLN For eyes   [DISCONTINUED] atorvastatin (LIPITOR)  10 MG tablet Take 1 tablet by mouth once daily   [DISCONTINUED] pantoprazole (PROTONIX) 40 MG tablet Take 40 mg by mouth every morning.       08/30/2022   10:07 AM 10/20/2021    9:55 AM 04/22/2021    9:32 AM 02/11/2021    3:49 PM  GAD 7 : Generalized Anxiety Score  Nervous, Anxious, on Edge 0 1 0 0  Control/stop worrying 0 1 0 0  Worry too much - different things 0 0 0 0  Trouble relaxing 0 0 0 0  Restless 0 1 0 0  Easily annoyed or irritable 0 1 0 0  Afraid - awful might happen 0 0 0 0  Total GAD 7 Score 0 4 0 0  Anxiety Difficulty Not difficult at all Not difficult at all Not difficult at all Not difficult at all       08/30/2022   10:07 AM 10/20/2021    9:54 AM 04/22/2021    9:32 AM  Depression screen PHQ 2/9  Decreased Interest 0 1 1  Down, Depressed, Hopeless 0 1 1  PHQ - 2 Score 0 2 2  Altered sleeping 0 0 0  Tired, decreased energy 0 1  1  Change in appetite 0 2 2  Feeling bad or failure about yourself  0 0 0  Trouble concentrating 0 0 0  Moving slowly or fidgety/restless 0 0 0  Suicidal thoughts 0 0 0  PHQ-9 Score 0 5 5  Difficult doing work/chores Not difficult at all Not difficult at all Not difficult at all    BP Readings from Last 3 Encounters:  08/30/22 112/66  10/20/21 128/70  04/22/21 124/68    Physical Exam Vitals and nursing note reviewed.  Constitutional:      General: She is not in acute distress.    Appearance: She is well-developed.  HENT:     Head: Normocephalic and atraumatic.     Right Ear: Tympanic membrane and ear canal normal.     Left Ear: Tympanic membrane and ear canal normal.     Nose:     Right Sinus: No maxillary sinus tenderness.     Left Sinus: No maxillary sinus tenderness.  Eyes:     General: No scleral icterus.       Right eye: No discharge.        Left eye: No discharge.     Conjunctiva/sclera: Conjunctivae normal.  Neck:     Thyroid: No thyromegaly.     Vascular: No carotid bruit.  Cardiovascular:     Rate and Rhythm: Normal rate and regular rhythm.     Pulses: Normal pulses.     Heart sounds: Normal heart sounds.  Pulmonary:     Effort: Pulmonary effort is normal. No respiratory distress.     Breath sounds: No wheezing.  Chest:  Breasts:    Right: No mass, nipple discharge, skin change or tenderness.     Left: No mass, nipple discharge, skin change or tenderness.  Abdominal:     General: Bowel sounds are normal.     Palpations: Abdomen is soft.     Tenderness: There is no abdominal tenderness.  Musculoskeletal:     Cervical back: Normal range of motion. No erythema.     Left knee: No crepitus. Tenderness present over the medial joint line. No MCL laxity or ACL laxity.    Instability Tests: Anterior drawer test negative.     Right lower leg: No edema.  Left lower leg: No edema.  Lymphadenopathy:     Cervical: No cervical adenopathy.  Skin:    General:  Skin is warm and dry.     Findings: No rash.  Neurological:     Mental Status: She is alert and oriented to person, place, and time.     Cranial Nerves: No cranial nerve deficit.     Sensory: No sensory deficit.     Deep Tendon Reflexes: Reflexes are normal and symmetric.  Psychiatric:        Attention and Perception: Attention normal.        Mood and Affect: Mood normal.    Diabetic Foot Exam - Simple   Simple Foot Form Diabetic Foot exam was performed with the following findings: Yes 08/30/2022 10:26 AM  Visual Inspection No deformities, no ulcerations, no other skin breakdown bilaterally: Yes Sensation Testing Intact to touch and monofilament testing bilaterally: Yes Pulse Check Posterior Tibialis and Dorsalis pulse intact bilaterally: Yes Comments      Wt Readings from Last 3 Encounters:  08/30/22 209 lb 6.4 oz (95 kg)  10/20/21 210 lb (95.3 kg)  04/22/21 208 lb 12.8 oz (94.7 kg)    BP 112/66   Pulse 85   Ht 5\' 5"  (1.651 m)   Wt 209 lb 6.4 oz (95 kg)   SpO2 94%   BMI 34.85 kg/m   Assessment and Plan:  Problem List Items Addressed This Visit       Cardiovascular and Mediastinum   Aortic atherosclerosis ([place]) (Chronic)    Tolerating statin medications without concerns LDL is  Lab Results  Component Value Date   LDLCALC 58 04/22/2021  with a goal of < 70. Current dose will be adjusted if needed.       Relevant Medications   atorvastatin (LIPITOR) 10 MG tablet   Other Relevant Orders   Lipid panel     Respiratory   Centrilobular emphysema ([facility]) (Chronic)    Symptoms controlled with Anoro and PRN albuterol        Endocrine   Hyperlipidemia associated with type 2 diabetes mellitus ([place])    Tolerating statin medications without concerns LDL is  Lab Results  Component Value Date   LDLCALC 58 04/22/2021        Relevant Medications   atorvastatin (LIPITOR) 10 MG tablet   Type II diabetes mellitus with complication ([facility]) (Chronic)    Clinically  stable without s/s of hypoglycemia. On diet only at this time Lab Results  Component Value Date   HGBA1C 7.1 (H) 10/20/2021  Eye exam due       Relevant Medications   atorvastatin (LIPITOR) 10 MG tablet   Other Relevant Orders   Comprehensive metabolic panel   Hemoglobin A1c   Microalbumin / creatinine urine ratio   TSH     Other   Personal history of colonic polyps   Relevant Orders   Ambulatory referral to Gastroenterology   Current tobacco use (Chronic)    Ongoing tobacco use with some intermittent wheezing Participating in lung cancer screening      Relevant Orders   Ambulatory Referral Lung Cancer Screening [place] [facility]   Other Visit Diagnoses     Annual physical exam    -  Primary   Relevant Orders   CBC with Differential/Platelet   Comprehensive metabolic panel   Hemoglobin A1c   Lipid panel   TSH   Encounter for screening mammogram for breast cancer       Relevant Orders  MM 3D SCREENING MAMMOGRAM BILATERAL BREAST   Acute pain of left knee       s/p fall at home 2 weeks ago continue conservative therapy with Advil, limited activities f/u with SM if no ongoing improvement       Return in about 6 months (around 03/02/2023) for diabetes, MAW with [facility].   Partially dictated using Dragon software, any errors are not intentional.  Glean Hess, MD [place], [place]

## 2022-08-30 NOTE — Assessment & Plan Note (Signed)
Tolerating statin medications without concerns LDL is  Lab Results  Component Value Date   LDLCALC 58 04/22/2021   with a goal of < 70. Current dose will be adjusted if needed.

## 2022-08-30 NOTE — Patient Instructions (Signed)
Call [facility] to schedule your mammogram at [phone].  Take Vitamin D 1000 IU daily.

## 2022-08-31 DIAGNOSIS — I700 Atherosclerosis of aorta: Secondary | ICD-10-CM | POA: Diagnosis not present

## 2022-08-31 DIAGNOSIS — E118 Type 2 diabetes mellitus with unspecified complications: Secondary | ICD-10-CM | POA: Diagnosis not present

## 2022-08-31 DIAGNOSIS — Z0000 Encounter for general adult medical examination without abnormal findings: Secondary | ICD-10-CM | POA: Diagnosis not present

## 2022-09-03 ENCOUNTER — Other Ambulatory Visit: Payer: Self-pay | Admitting: Internal Medicine

## 2022-09-03 DIAGNOSIS — E118 Type 2 diabetes mellitus with unspecified complications: Secondary | ICD-10-CM

## 2022-09-03 LAB — LIPID PANEL
Chol/HDL Ratio: 4.3 ratio (ref 0.0–4.4)
Cholesterol, Total: 164 mg/dL (ref 100–199)
HDL: 38 mg/dL — ABNORMAL LOW (ref >39)
LDL Chol Calc (NIH): 94 mg/dL (ref 0–99)
Triglycerides: 187 mg/dL — ABNORMAL HIGH (ref 0–149)
VLDL Cholesterol Cal: 32 mg/dL (ref 5–40)

## 2022-09-03 LAB — CBC WITH DIFFERENTIAL/PLATELET
Basophils Absolute: 0.1 10*3/uL (ref 0.0–0.2)
Basos: 1 %
EOS (ABSOLUTE): 0.9 10*3/uL — ABNORMAL HIGH (ref 0.0–0.4)
Eos: 10 %
Hematocrit: 47.7 % — ABNORMAL HIGH (ref 34.0–46.6)
Hemoglobin: 16 g/dL — ABNORMAL HIGH (ref 11.1–15.9)
Immature Grans (Abs): 0.1 10*3/uL (ref 0.0–0.1)
Immature Granulocytes: 1 %
Lymphocytes Absolute: 2.2 10*3/uL (ref 0.7–3.1)
Lymphs: 26 %
MCH: 29.1 pg (ref 26.6–33.0)
MCHC: 33.5 g/dL (ref 31.5–35.7)
MCV: 87 fL (ref 79–97)
Monocytes Absolute: 0.7 10*3/uL (ref 0.1–0.9)
Monocytes: 9 %
Neutrophils Absolute: 4.7 10*3/uL (ref 1.4–7.0)
Neutrophils: 53 %
Platelets: 256 10*3/uL (ref 150–450)
RBC: 5.5 x10E6/uL — ABNORMAL HIGH (ref 3.77–5.28)
RDW: 14.1 % (ref 11.7–15.4)
WBC: 8.6 10*3/uL (ref 3.4–10.8)

## 2022-09-03 LAB — COMPREHENSIVE METABOLIC PANEL
ALT: 21 IU/L (ref 0–32)
AST: 14 IU/L (ref 0–40)
Albumin/Globulin Ratio: 1.8 (ref 1.2–2.2)
Albumin: 4.3 g/dL (ref 3.9–4.9)
Alkaline Phosphatase: 102 IU/L (ref 44–121)
BUN/Creatinine Ratio: 13 (ref 12–28)
BUN: 14 mg/dL (ref 8–27)
Bilirubin Total: 0.4 mg/dL (ref 0.0–1.2)
CO2: 21 mmol/L (ref 20–29)
Calcium: 9.4 mg/dL (ref 8.7–10.3)
Chloride: 106 mmol/L (ref 96–106)
Creatinine, Ser: 1.04 mg/dL — ABNORMAL HIGH (ref 0.57–1.00)
Globulin, Total: 2.4 g/dL (ref 1.5–4.5)
Glucose: 128 mg/dL — ABNORMAL HIGH (ref 70–99)
Potassium: 4.6 mmol/L (ref 3.5–5.2)
Sodium: 143 mmol/L (ref 134–144)
Total Protein: 6.7 g/dL (ref 6.0–8.5)
eGFR: 59 mL/min/{1.73_m2} — ABNORMAL LOW (ref >59)

## 2022-09-03 LAB — MICROALBUMIN / CREATININE URINE RATIO
Creatinine, Urine: 125.3 mg/dL
Microalb/Creat Ratio: 6 mg/g creat (ref 0–29)
Microalbumin, Urine: 7.5 ug/mL

## 2022-09-03 LAB — HEMOGLOBIN A1C
Est. average glucose Bld gHb Est-mCnc: 163 mg/dL
Hgb A1c MFr Bld: 7.3 % — ABNORMAL HIGH (ref 4.8–5.6)

## 2022-09-03 LAB — TSH: TSH: 1.03 u[IU]/mL (ref 0.450–4.500)

## 2022-09-03 MED ORDER — METFORMIN HCL ER 500 MG PO TB24
500.0000 mg | ORAL_TABLET | Freq: Every day | ORAL | 1 refills | Status: AC
Start: 2022-09-03 — End: ?

## 2022-09-06 ENCOUNTER — Other Ambulatory Visit: Payer: Self-pay

## 2022-09-06 ENCOUNTER — Telehealth: Payer: Self-pay | Admitting: Internal Medicine

## 2022-09-06 MED ORDER — BLOOD GLUCOSE METER KIT: PACK | 0 refills | Status: AC

## 2022-09-06 NOTE — Telephone Encounter (Signed)
Patient was returning call for lab results. Please follow up with patient.

## 2022-09-06 NOTE — Telephone Encounter (Signed)
See result notes. 

## 2022-09-27 ENCOUNTER — Encounter: Payer: Self-pay | Admitting: Gastroenterology

## 2022-09-27 NOTE — Anesthesia Preprocedure Evaluation (Addendum)
Anesthesia Evaluation  Patient identified by MRN, date of birth, ID band Patient awake    Reviewed: Allergy & Precautions, H&P , NPO status , Patient's Chart, lab work & pertinent test results  History of Anesthesia Complications (+) PONV and history of anesthetic complications  Airway Mallampati: II  TM Distance: >3 FB Neck ROM: Full   Comment: Note hx cleft palate surgery Dental no notable dental hx.    Pulmonary neg pulmonary ROS, asthma , COPD, Current Smoker   Pulmonary exam normal breath sounds clear to auscultation       Cardiovascular + CAD and + DOE  negative cardio ROS Normal cardiovascular exam+ pacemaker  Rhythm:Regular Rate:Normal   DOPPLER ECHO and OTHER SPECIAL PROCEDURES                 Aortic: No AR                      No AS                         133.8 cm/sec peak vel      7.2 mmHg peak grad                 Mitral: No MR                      No MS                         MV Inflow E Vel = 73.7 cm/sec     MV Annulus E'Vel = 9.5 cm/sec                         E/E'Ratio = 7.8              Tricuspid: No TR                      No TS              Pulmonary: TRIVIAL PR                 No PS                         74.7 cm/sec peak vel       2.2 mmHg peak grad  _________________________________________________________________________________________  INTERPRETATION  NORMAL LEFT VENTRICULAR SYSTOLIC FUNCTION  NORMAL RIGHT VENTRICULAR SYSTOLIC FUNCTION  NO VALVULAR STENOSIS  TRIVIAL PR  EF 55%     Neuro/Psych negative neurological ROS  negative psych ROS   GI/Hepatic negative GI ROS, Neg liver ROS,GERD  ,,  Endo/Other  negative endocrine ROSdiabetes, Type 2, Oral Hypoglycemic Agents    Renal/GU negative Renal ROS  negative genitourinary   Musculoskeletal negative musculoskeletal ROS (+) Arthritis ,    Abdominal   Peds negative pediatric ROS (+)  Hematology negative hematology ROS (+)    Anesthesia Other Findings PONV (postoperative nausea and vomiting) Asthma Arthritis  GERD (gastroesophageal reflux disease) History of measles, mumps, or rubella Cataract Smoking  Past Surgical History She has a past surgical history that includes Cholecystectomy (2010); right ankle surgery (1984);  cleft palate surgery;  Colonoscopy; and polypectomy.            Reproductive/Obstetrics negative OB ROS  Anesthesia Physical Anesthesia Plan  ASA: 3  Anesthesia Plan: General   Post-op Pain Management:    Induction: Intravenous  PONV Risk Score and Plan:   Airway Management Planned: Natural Airway and Nasal Cannula  Additional Equipment:   Intra-op Plan:   Post-operative Plan:   Informed Consent: I have reviewed the patients History and Physical, chart, labs and discussed the procedure including the risks, benefits and alternatives for the proposed anesthesia with the patient or authorized representative who has indicated his/her understanding and acceptance.     Dental Advisory Given  Plan Discussed with: Anesthesiologist, CRNA and Surgeon  Anesthesia Plan Comments: (Patient consented for risks of anesthesia including but not limited to:  - adverse reactions to medications - risk of airway placement if required - damage to eyes, teeth, lips or other oral mucosa - nerve damage due to positioning  - sore throat or hoarseness - Damage to heart, brain, nerves, lungs, other parts of body or loss of life  Patient voiced understanding.)         Anesthesia Quick Evaluation

## 2022-10-09 ENCOUNTER — Encounter: Payer: Self-pay | Admitting: Gastroenterology

## 2022-10-09 ENCOUNTER — Ambulatory Visit: Payer: Medicare Other | Admitting: Anesthesiology

## 2022-10-09 ENCOUNTER — Encounter
Admission: RE | Disposition: A | Discharge status: Home / Self Care | Payer: Self-pay | Source: Home / Self Care | Attending: Gastroenterology

## 2022-10-09 ENCOUNTER — Other Ambulatory Visit: Payer: Self-pay

## 2022-10-09 ENCOUNTER — Ambulatory Visit
Admission: RE | Admit: 2022-10-09 | Discharge: 2022-10-09 | Disposition: A | Discharge status: Home / Self Care | Payer: Medicare Other | Attending: Gastroenterology | Admitting: Gastroenterology

## 2022-10-09 DIAGNOSIS — F17210 Nicotine dependence, cigarettes, uncomplicated: Secondary | ICD-10-CM | POA: Insufficient documentation

## 2022-10-09 DIAGNOSIS — J449 Chronic obstructive pulmonary disease, unspecified: Secondary | ICD-10-CM | POA: Insufficient documentation

## 2022-10-09 DIAGNOSIS — D122 Benign neoplasm of ascending colon: Secondary | ICD-10-CM | POA: Diagnosis not present

## 2022-10-09 DIAGNOSIS — Z9049 Acquired absence of other specified parts of digestive tract: Secondary | ICD-10-CM | POA: Insufficient documentation

## 2022-10-09 DIAGNOSIS — I2510 Atherosclerotic heart disease of native coronary artery without angina pectoris: Secondary | ICD-10-CM | POA: Diagnosis not present

## 2022-10-09 DIAGNOSIS — Z833 Family history of diabetes mellitus: Secondary | ICD-10-CM | POA: Insufficient documentation

## 2022-10-09 DIAGNOSIS — Z7984 Long term (current) use of oral hypoglycemic drugs: Secondary | ICD-10-CM | POA: Insufficient documentation

## 2022-10-09 DIAGNOSIS — K5730 Diverticulosis of large intestine without perforation or abscess without bleeding: Secondary | ICD-10-CM | POA: Diagnosis not present

## 2022-10-09 DIAGNOSIS — Z1211 Encounter for screening for malignant neoplasm of colon: Secondary | ICD-10-CM | POA: Insufficient documentation

## 2022-10-09 DIAGNOSIS — Z86010 Personal history of colonic polyps: Secondary | ICD-10-CM | POA: Insufficient documentation

## 2022-10-09 DIAGNOSIS — K635 Polyp of colon: Secondary | ICD-10-CM | POA: Diagnosis not present

## 2022-10-09 DIAGNOSIS — K640 First degree hemorrhoids: Secondary | ICD-10-CM | POA: Diagnosis not present

## 2022-10-09 DIAGNOSIS — E119 Type 2 diabetes mellitus without complications: Secondary | ICD-10-CM | POA: Diagnosis not present

## 2022-10-09 DIAGNOSIS — K219 Gastro-esophageal reflux disease without esophagitis: Secondary | ICD-10-CM | POA: Diagnosis not present

## 2022-10-09 HISTORY — PX: COLONOSCOPY WITH PROPOFOL: SHX5780

## 2022-10-09 HISTORY — DX: Centrilobular emphysema: J43.2

## 2022-10-09 HISTORY — PX: POLYPECTOMY: SHX5525

## 2022-10-09 LAB — GLUCOSE, CAPILLARY: Glucose-Capillary: 120 mg/dL — ABNORMAL HIGH (ref 70–99)

## 2022-10-09 SURGERY — COLONOSCOPY WITH PROPOFOL
Anesthesia: General | Site: Rectum

## 2022-10-09 MED ORDER — PROPOFOL 10 MG/ML IV BOLUS
INTRAVENOUS | Status: DC | PRN
  Administered 2022-10-09: 80 mg via INTRAVENOUS

## 2022-10-09 MED ORDER — STERILE WATER FOR IRRIGATION IR SOLN
Status: DC | PRN
  Administered 2022-10-09: 200 mL

## 2022-10-09 MED ORDER — LACTATED RINGERS IV SOLN: INTRAVENOUS | Status: DC

## 2022-10-09 MED ORDER — LIDOCAINE HCL (CARDIAC) PF 100 MG/5ML IV SOSY
PREFILLED_SYRINGE | INTRAVENOUS | Status: DC | PRN
  Administered 2022-10-09: 50 mg via INTRAVENOUS

## 2022-10-09 MED ORDER — SODIUM CHLORIDE 0.9 % IV SOLN: INTRAVENOUS | Status: DC

## 2022-10-09 SURGICAL SUPPLY — 21 items

## 2022-10-09 NOTE — Transfer of Care (Signed)
Immediate Anesthesia Transfer of Care Note  Patient: Diamond Jackson  Procedure(s) Performed: COLONOSCOPY WITH PROPOFOL  Patient Location: PACU  Anesthesia Type: General  Level of Consciousness: awake, alert  and patient cooperative  Airway and Oxygen Therapy: Patient Spontanous Breathing and Patient connected to supplemental oxygen  Post-op Assessment: Post-op Vital signs reviewed, Patient's Cardiovascular Status Stable, Respiratory Function Stable, Patent Airway and No signs of Nausea or vomiting  Post-op Vital Signs: Reviewed and stable  Complications: No notable events documented.

## 2022-10-09 NOTE — Anesthesia Postprocedure Evaluation (Signed)
Anesthesia Post Note  Patient: Diamond Jackson  Procedure(s) Performed: COLONOSCOPY WITH PROPOFOL POLYPECTOMY (Rectum)  Patient location during evaluation: PACU Anesthesia Type: General Level of consciousness: awake and alert Pain management: pain level controlled Vital Signs Assessment: post-procedure vital signs reviewed and stable Respiratory status: spontaneous breathing, nonlabored ventilation, respiratory function stable and patient connected to nasal cannula oxygen Cardiovascular status: blood pressure returned to baseline and stable Postop Assessment: no apparent nausea or vomiting Anesthetic complications: no   No notable events documented.   Last Vitals:  Vitals:   10/09/22 0825 10/09/22 0830  BP: 108/75 110/74  Pulse: 86 86  Resp: 14 18  Temp: (!) 36.3 C (!) 36.3 C  SpO2: 93% 93%    Last Pain:  Vitals:   10/09/22 0830  TempSrc:   PainSc: 0-No pain                 Diamond Jackson

## 2022-10-09 NOTE — H&P (Signed)
Diamond Minium, MD [facility] [address], [address] [place], [place] [address] [phone] Fax : [phone]  Primary Care Physician:  Reubin Milan, MD Primary Gastroenterologist:  Dr. Servando Snare  Pre-Procedure History & Physical: HPI:  Diamond Jackson is a 67 y.o. female is here for an colonoscopy.   Past Medical History:  Diagnosis Date   Arthritis    right ankle   Asthma    rare- (in very dusty conditions)   Cataract 12/11/2019   both eyes   Centrilobular emphysema (HCC)    GERD (gastroesophageal reflux disease)    History of measles, mumps, or rubella 03/22/2015   DID have Chicken Pox. DID have Measles. DID have Mumps. DID have Rubella.     PONV (postoperative nausea and vomiting)     Past Surgical History:  Procedure Laterality Date   ANKLE SURGERY Right 1984   BREAST BIOPSY Left    benign two areas/clips   CHOLECYSTECTOMY  2010   CLEFT PALATE REPAIR     as an infant   COLONOSCOPY  2010   normal   COLONOSCOPY WITH PROPOFOL N/A 05/17/2018   Procedure: COLONOSCOPY WITH BIOPSIES;  Surgeon: Diamond Minium, MD;  Location: [facility];  Service: Endoscopy;  Laterality: N/A;   COLONOSCOPY WITH PROPOFOL N/A 09/29/2019   Procedure: COLONOSCOPY WITH BIOPSY;  Surgeon: Diamond Minium, MD;  Location: [facility];  Service: Endoscopy;  Laterality: N/A;  priority 3   POLYPECTOMY  05/17/2018   Procedure: POLYPECTOMY;  Surgeon: Diamond Minium, MD;  Location: [facility];  Service: Endoscopy;;   POLYPECTOMY N/A 09/29/2019   Procedure: POLYPECTOMY;  Surgeon: Diamond Minium, MD;  Location: [facility];  Service: Endoscopy;  Laterality: N/A;  Clips x 2 placed at site of Hepatic Flexure Polyp site removal    Prior to Admission medications   Medication Sig Start Date End Date Taking? Authorizing Provider  albuterol (VENTOLIN HFA) 108 (90 Base) MCG/ACT inhaler INHALE 1 TO 2 PUFFS BY MOUTH EVERY 6 HOURS AS NEEDED FOR WHEEZING FOR SHORTNESS OF BREATH 06/07/22  Yes  Reubin Milan, MD  [facility] ELLIPTA 62.5-25 MCG/ACT AEPB 1 puff daily. 04/05/21  Yes [provider]  atorvastatin (LIPITOR) 10 MG tablet Take 1 tablet (10 mg total) by mouth daily. 08/30/22  Yes Reubin Milan, MD  blood glucose meter kit and supplies Dispense based on patient and insurance preference. Use up to four times daily as directed. (FOR ICD-10 E10.9, E11.9). 09/06/22  Yes Reubin Milan, MD  cetirizine (ZYRTEC) 10 MG tablet Take 10 mg by mouth daily as needed for allergies.   Yes [provider]  metFORMIN (GLUCOPHAGE-XR) 500 MG 24 hr tablet Take 1 tablet (500 mg total) by mouth daily with breakfast. 09/03/22  Yes Reubin Milan, MD  OPCON-A 0.027-0.315 % SOLN For eyes 02/06/20  Yes [provider]  pantoprazole (PROTONIX) 40 MG tablet Take 1 tablet (40 mg total) by mouth every morning. 08/30/22  Yes Reubin Milan, MD  montelukast (SINGULAIR) 10 MG tablet Take 1 tablet (10 mg total) by mouth at bedtime. Patient not taking: Reported on 09/27/2022 04/22/21   Reubin Milan, MD    Allergies as of 08/30/2022   (No Known Allergies)    Family History  Problem Relation Age of Onset   Diabetes Mother    Diabetes Brother    Lung cancer Father    Breast cancer Maternal Aunt 16   Breast cancer Other 97    Social History  Socioeconomic History   Marital status: Widowed    Spouse name: Not on file   Number of children: Not on file   Years of education: Not on file   Highest education level: Not on file  Occupational History   Not on file  Tobacco Use   Smoking status: Every Day    Packs/day: 1.50    Years: 48.00    Additional pack years: 0.00    Total pack years: 72.00    Types: Cigarettes   Smokeless tobacco: Never   Tobacco comments:    0.75ppd currently  Vaping Use   Vaping Use: Never used  Substance and Sexual Activity   Alcohol use: No    Alcohol/week: 0.0 standard drinks of alcohol   Drug use: No   Sexual activity: Yes   Other Topics Concern   Not on file  Social History Narrative   Not on file   Social Determinants of Health   Financial Resource Strain: Not on file  Food Insecurity: Not on file  Transportation Needs: Not on file  Physical Activity: Not on file  Stress: Not on file  Social Connections: Not on file  Intimate Partner Violence: Not on file    Review of Systems: See HPI, otherwise negative ROS  Physical Exam: BP 115/75   Temp 97.7 F (36.5 C) (Tympanic)   Resp 13   Ht 5\' 5"  (1.651 m)   Wt 92.1 kg   SpO2 99%   BMI 33.80 kg/m  General:   Alert,  pleasant and cooperative in NAD Head:  Normocephalic and atraumatic. Neck:  Supple; no masses or thyromegaly. Lungs:  Clear throughout to auscultation.    Heart:  Regular rate and rhythm. Abdomen:  Soft, nontender and nondistended. Normal bowel sounds, without guarding, and without rebound.   Neurologic:  Alert and  oriented x4;  grossly normal neurologically.  Impression/Plan: Diamond Jackson is here for an colonoscopy to be performed for a history of adenomatous polyps on 2021   Risks, benefits, limitations, and alternatives regarding  colonoscopy have been reviewed with the patient.  Questions have been answered.  All parties agreeable.   Diamond Minium, MD  10/09/2022, 8:01 AM

## 2022-10-09 NOTE — Op Note (Signed)
[facility] Patient Name: Diamond Jackson Procedure Date: 10/09/2022 8:01 AM MRN: [id number] Account #: [id number] Date of Birth: [birth year] Admit Type: Outpatient Age: 67 Room: [facility] OR ROOM 01 Gender: Female Note Status: Finalized Instrument Name: [id number] Procedure:             Colonoscopy Indications:           High risk colon cancer surveillance: Personal history                         of colonic polyps Providers:             Midge Minium MD, MD Referring MD:          Bari Edward, MD (Referring MD) Medicines:             Propofol per Anesthesia Complications:         No immediate complications. Procedure:             Pre-Anesthesia Assessment:                        - Prior to the procedure, a History and Physical was                         performed, and patient medications and allergies were                         reviewed. The patient's tolerance of previous                         anesthesia was also reviewed. The risks and benefits                         of the procedure and the sedation options and risks                         were discussed with the patient. All questions were                         answered, and informed consent was obtained. Prior                         Anticoagulants: The patient has taken no anticoagulant                         or antiplatelet agents. ASA Grade Assessment: II - A                         patient with mild systemic disease. After reviewing                         the risks and benefits, the patient was deemed in                         satisfactory condition to undergo the procedure.                        After obtaining informed consent, the colonoscope was  passed under direct vision. Throughout the procedure,                         the patient's blood pressure, pulse, and oxygen                         saturations were monitored continuously. The                          Colonoscope was introduced through the anus and                         advanced to the the cecum, identified by appendiceal                         orifice and ileocecal valve. The colonoscopy was                         performed without difficulty. The patient tolerated                         the procedure well. The quality of the bowel                         preparation was excellent. Findings:      The perianal and digital rectal examinations were normal.      Four sessile polyps were found in the ascending colon. The polyps were 2       to 5 mm in size. These polyps were removed with a cold snare. Resection       and retrieval were complete.      Multiple small-mouthed diverticula were found in the sigmoid colon.      Non-bleeding internal hemorrhoids were found during retroflexion. The       hemorrhoids were Grade I (internal hemorrhoids that do not prolapse). Impression:            - Four 2 to 5 mm polyps in the ascending colon,                         removed with a cold snare. Resected and retrieved.                        - Diverticulosis in the sigmoid colon.                        - Non-bleeding internal hemorrhoids. Recommendation:        - Discharge patient to home.                        - Resume previous diet.                        - Continue present medications.                        - Await pathology results.                        - Repeat colonoscopy in 5 years for surveillance. Procedure Code(s):     --- Professional ---  [address], Colonoscopy, flexible; with removal of                         tumor(s), polyp(s), or other lesion(s) by snare                         technique Diagnosis Code(s):     --- Professional ---                        Z86.010, Personal history of colonic polyps                        D12.2, Benign neoplasm of ascending colon CPT copyright 2022 American Medical Association. All rights reserved. The codes documented in  this report are preliminary and upon coder review may  be revised to meet current compliance requirements. Midge Minium MD, MD 10/09/2022 8:26:22 AM This report has been signed electronically. Number of Addenda: 0 Note Initiated On: 10/09/2022 8:01 AM Scope Withdrawal Time: 0 hours 6 minutes 31 seconds  Total Procedure Duration: 0 hours 14 minutes 12 seconds  Estimated Blood Loss:  Estimated blood loss: none.      [facility]

## 2022-10-10 ENCOUNTER — Encounter: Payer: Self-pay | Admitting: Gastroenterology

## 2022-10-10 LAB — SURGICAL PATHOLOGY

## 2022-10-25 DIAGNOSIS — R0602 Shortness of breath: Secondary | ICD-10-CM | POA: Diagnosis not present

## 2022-10-25 DIAGNOSIS — J449 Chronic obstructive pulmonary disease, unspecified: Secondary | ICD-10-CM | POA: Diagnosis not present

## 2022-10-25 DIAGNOSIS — J301 Allergic rhinitis due to pollen: Secondary | ICD-10-CM | POA: Diagnosis not present

## 2022-10-25 DIAGNOSIS — F17210 Nicotine dependence, cigarettes, uncomplicated: Secondary | ICD-10-CM | POA: Diagnosis not present

## 2022-11-01 ENCOUNTER — Ambulatory Visit (INDEPENDENT_AMBULATORY_CARE_PROVIDER_SITE_OTHER): Payer: Medicare Other

## 2022-11-01 VITALS — BP 118/62 | Ht 65.0 in | Wt 208.2 lb

## 2022-11-01 DIAGNOSIS — Z0000 Encounter for general adult medical examination without abnormal findings: Secondary | ICD-10-CM | POA: Diagnosis not present

## 2022-11-01 NOTE — Patient Instructions (Signed)
Ms. Diamond Jackson , Thank you for taking time to come for your Medicare Wellness Visit. I appreciate your ongoing commitment to your health goals. Please review the following plan we discussed and let me know if I can assist you in the future.   These are the goals we discussed:  Goals      DIET - EAT MORE FRUITS AND VEGETABLES        This is a list of the screening recommended for you and due dates:  Health Maintenance  Topic Date Due   Eye exam for diabetics  Never done   COVID-19 Vaccine (4 - 2023-24 season) 02/10/2022   Screening for Lung Cancer  12/29/2022   Flu Shot  01/11/2023   Hemoglobin A1C  03/03/2023   DTaP/Tdap/Td vaccine (2 - Td or Tdap) 04/18/2023   Mammogram  05/11/2023   Complete foot exam   08/30/2023   Yearly kidney function blood test for diabetes  08/31/2023   Yearly kidney health urinalysis for diabetes  08/31/2023   Medicare Annual Wellness Visit  11/01/2023   Colon Cancer Screening  10/09/2027   Pneumonia Vaccine  Completed   DEXA scan (bone density measurement)  Completed   Zoster (Shingles) Vaccine  Completed   Hepatitis C Screening: USPSTF Recommendation to screen - Ages 10-79 yo.  Addressed   HPV Vaccine  Aged Out    Advanced directives: no  Conditions/risks identified: none  Next appointment: Follow up in one year for your annual wellness visit 10/12/23 @ 1:30 pm in person   Preventive Care 65 Years and Older, Female Preventive care refers to lifestyle choices and visits with your health care provider that can promote health and wellness. What does preventive care include? A yearly physical exam. This is also called an annual well check. Dental exams once or twice a year. Routine eye exams. Ask your health care provider how often you should have your eyes checked. Personal lifestyle choices, including: Daily care of your teeth and gums. Regular physical activity. Eating a healthy diet. Avoiding tobacco and drug use. Limiting alcohol  use. Practicing safe sex. Taking low-dose aspirin every day. Taking vitamin and mineral supplements as recommended by your health care provider. What happens during an annual well check? The services and screenings done by your health care provider during your annual well check will depend on your age, overall health, lifestyle risk factors, and family history of disease. Counseling  Your health care provider may ask you questions about your: Alcohol use. Tobacco use. Drug use. Emotional well-being. Home and relationship well-being. Sexual activity. Eating habits. History of falls. Memory and ability to understand (cognition). Work and work Astronomer. Reproductive health. Screening  You may have the following tests or measurements: Height, weight, and BMI. Blood pressure. Lipid and cholesterol levels. These may be checked every 5 years, or more frequently if you are over 52 years old. Skin check. Lung cancer screening. You may have this screening every year starting at age 1 if you have a 30-pack-year history of smoking and currently smoke or have quit within the past 15 years. Fecal occult blood test (FOBT) of the stool. You may have this test every year starting at age 74. Flexible sigmoidoscopy or colonoscopy. You may have a sigmoidoscopy every 5 years or a colonoscopy every 10 years starting at age 20. Hepatitis C blood test. Hepatitis B blood test. Sexually transmitted disease (STD) testing. Diabetes screening. This is done by checking your blood sugar (glucose) after you have not eaten for  a while (fasting). You may have this done every 1-3 years. Bone density scan. This is done to screen for osteoporosis. You may have this done starting at age 25. Mammogram. This may be done every 1-2 years. Talk to your health care provider about how often you should have regular mammograms. Talk with your health care provider about your test results, treatment options, and if necessary,  the need for more tests. Vaccines  Your health care provider may recommend certain vaccines, such as: Influenza vaccine. This is recommended every year. Tetanus, diphtheria, and acellular pertussis (Tdap, Td) vaccine. You may need a Td booster every 10 years. Zoster vaccine. You may need this after age 25. Pneumococcal 13-valent conjugate (PCV13) vaccine. One dose is recommended after age 87. Pneumococcal polysaccharide (PPSV23) vaccine. One dose is recommended after age 15. Talk to your health care provider about which screenings and vaccines you need and how often you need them. This information is not intended to replace advice given to you by your health care provider. Make sure you discuss any questions you have with your health care provider. Document Released: 06/25/2015 Document Revised: 02/16/2016 Document Reviewed: 03/30/2015 Elsevier Interactive Patient Education  2017 [employer].  Fall Prevention in the Home Falls can cause injuries. They can happen to people of all ages. There are many things you can do to make your home safe and to help prevent falls. What can I do on the outside of my home? Regularly fix the edges of walkways and driveways and fix any cracks. Remove anything that might make you trip as you walk through a door, such as a raised step or threshold. Trim any bushes or trees on the path to your home. Use bright outdoor lighting. Clear any walking paths of anything that might make someone trip, such as rocks or tools. Regularly check to see if handrails are loose or broken. Make sure that both sides of any steps have handrails. Any raised decks and porches should have guardrails on the edges. Have any leaves, snow, or ice cleared regularly. Use sand or salt on walking paths during winter. Clean up any spills in your garage right away. This includes oil or grease spills. What can I do in the bathroom? Use night lights. Install grab bars by the toilet and in the  tub and shower. Do not use towel bars as grab bars. Use non-skid mats or decals in the tub or shower. If you need to sit down in the shower, use a plastic, non-slip stool. Keep the floor dry. Clean up any water that spills on the floor as soon as it happens. Remove soap buildup in the tub or shower regularly. Attach bath mats securely with double-sided non-slip rug tape. Do not have throw rugs and other things on the floor that can make you trip. What can I do in the bedroom? Use night lights. Make sure that you have a light by your bed that is easy to reach. Do not use any sheets or blankets that are too big for your bed. They should not hang down onto the floor. Have a firm chair that has side arms. You can use this for support while you get dressed. Do not have throw rugs and other things on the floor that can make you trip. What can I do in the kitchen? Clean up any spills right away. Avoid walking on wet floors. Keep items that you use a lot in easy-to-reach places. If you need to reach something above  you, use a strong step stool that has a grab bar. Keep electrical cords out of the way. Do not use floor polish or wax that makes floors slippery. If you must use wax, use non-skid floor wax. Do not have throw rugs and other things on the floor that can make you trip. What can I do with my stairs? Do not leave any items on the stairs. Make sure that there are handrails on both sides of the stairs and use them. Fix handrails that are broken or loose. Make sure that handrails are as long as the stairways. Check any carpeting to make sure that it is firmly attached to the stairs. Fix any carpet that is loose or worn. Avoid having throw rugs at the top or bottom of the stairs. If you do have throw rugs, attach them to the floor with carpet tape. Make sure that you have a light switch at the top of the stairs and the bottom of the stairs. If you do not have them, ask someone to add them for  you. What else can I do to help prevent falls? Wear shoes that: Do not have high heels. Have rubber bottoms. Are comfortable and fit you well. Are closed at the toe. Do not wear sandals. If you use a stepladder: Make sure that it is fully opened. Do not climb a closed stepladder. Make sure that both sides of the stepladder are locked into place. Ask someone to hold it for you, if possible. Clearly mark and make sure that you can see: Any grab bars or handrails. First and last steps. Where the edge of each step is. Use tools that help you move around (mobility aids) if they are needed. These include: Canes. Walkers. Scooters. Crutches. Turn on the lights when you go into a dark area. Replace any light bulbs as soon as they burn out. Set up your furniture so you have a clear path. Avoid moving your furniture around. If any of your floors are uneven, fix them. If there are any pets around you, be aware of where they are. Review your medicines with your doctor. Some medicines can make you feel dizzy. This can increase your chance of falling. Ask your doctor what other things that you can do to help prevent falls. This information is not intended to replace advice given to you by your health care provider. Make sure you discuss any questions you have with your health care provider. Document Released: 03/25/2009 Document Revised: 11/04/2015 Document Reviewed: 07/03/2014 Elsevier Interactive Patient Education  2017 [employer].

## 2022-11-01 NOTE — Progress Notes (Signed)
Subjective:   Diamond Jackson is a 67 y.o. female who presents for an Initial Medicare Annual Wellness Visit.  Review of Systems     Cardiac Risk Factors include: advanced age (>38men, >90 women);dyslipidemia;diabetes mellitus     Objective:    Today's Vitals   11/01/22 1428  BP: 118/62  Weight: 208 lb 3.2 oz (94.4 kg)  Height: 5\' 5"  (1.651 m)   Body mass index is 34.65 kg/m.     11/01/2022    2:35 PM 09/29/2019    8:06 AM 04/29/2019    8:03 AM 04/10/2016    9:35 AM  Advanced Directives  Does Patient Have a Medical Advance Directive? No No No No  Would patient like information on creating a medical advance directive? No - Patient declined No - Patient declined No - Patient declined     Current Medications (verified) Outpatient Encounter Medications as of 11/01/2022  Medication Sig   albuterol (VENTOLIN HFA) 108 (90 Base) MCG/ACT inhaler INHALE 1 TO 2 PUFFS BY MOUTH EVERY 6 HOURS AS NEEDED FOR WHEEZING FOR SHORTNESS OF BREATH   ANORO ELLIPTA 62.5-25 MCG/ACT AEPB 1 puff daily.   atorvastatin (LIPITOR) 10 MG tablet Take 1 tablet (10 mg total) by mouth daily.   blood glucose meter kit and supplies Dispense based on patient and insurance preference. Use up to four times daily as directed. (FOR ICD-10 E10.9, E11.9).   cetirizine (ZYRTEC) 10 MG tablet Take 10 mg by mouth daily as needed for allergies.   metFORMIN (GLUCOPHAGE-XR) 500 MG 24 hr tablet Take 1 tablet (500 mg total) by mouth daily with breakfast.   OPCON-A 0.027-0.315 % SOLN For eyes   pantoprazole (PROTONIX) 40 MG tablet Take 1 tablet (40 mg total) by mouth every morning.   montelukast (SINGULAIR) 10 MG tablet Take 1 tablet (10 mg total) by mouth at bedtime. (Patient not taking: Reported on 09/27/2022)   No facility-administered encounter medications on file as of 11/01/2022.    Allergies (verified) Patient has no known allergies.   History: Past Medical History:  Diagnosis Date   Arthritis    right ankle    Asthma    rare- (in very dusty conditions)   Cataract 12/11/2019   both eyes   Centrilobular emphysema (HCC)    GERD (gastroesophageal reflux disease)    History of measles, mumps, or rubella 03/22/2015   DID have Chicken Pox. DID have Measles. DID have Mumps. DID have Rubella.     PONV (postoperative nausea and vomiting)    Past Surgical History:  Procedure Laterality Date   ANKLE SURGERY Right 1984   BREAST BIOPSY Left    benign two areas/clips   CHOLECYSTECTOMY  2010   CLEFT PALATE REPAIR     as an infant   COLONOSCOPY  2010   normal   COLONOSCOPY WITH PROPOFOL N/A 05/17/2018   Procedure: COLONOSCOPY WITH BIOPSIES;  Surgeon: Midge Minium, MD;  Location: [facility];  Service: Endoscopy;  Laterality: N/A;   COLONOSCOPY WITH PROPOFOL N/A 09/29/2019   Procedure: COLONOSCOPY WITH BIOPSY;  Surgeon: Midge Minium, MD;  Location: [facility];  Service: Endoscopy;  Laterality: N/A;  priority 3   COLONOSCOPY WITH PROPOFOL N/A 10/09/2022   Procedure: COLONOSCOPY WITH PROPOFOL;  Surgeon: Midge Minium, MD;  Location: [facility];  Service: Endoscopy;  Laterality: N/A;   POLYPECTOMY  05/17/2018   Procedure: POLYPECTOMY;  Surgeon: Midge Minium, MD;  Location: [facility];  Service: Endoscopy;;   POLYPECTOMY N/A 09/29/2019  Procedure: POLYPECTOMY;  Surgeon: Midge Minium, MD;  Location: [facility];  Service: Endoscopy;  Laterality: N/A;  Clips x 2 placed at site of Hepatic Flexure Polyp site removal   POLYPECTOMY  10/09/2022   Procedure: POLYPECTOMY;  Surgeon: Midge Minium, MD;  Location: [facility];  Service: Endoscopy;;   Family History  Problem Relation Age of Onset   Diabetes Mother    Diabetes Brother    Lung cancer Father    Breast cancer Maternal Aunt 58   Breast cancer Other 20   Social History   Socioeconomic History   Marital status: Widowed    Spouse name: Not on file   Number of children: Not on file   Years of education: Not  on file   Highest education level: Not on file  Occupational History   Not on file  Tobacco Use   Smoking status: Every Day    Packs/day: 1.50    Years: 48.00    Additional pack years: 0.00    Total pack years: 72.00    Types: Cigarettes   Smokeless tobacco: Never   Tobacco comments:    0.75ppd currently  Vaping Use   Vaping Use: Never used  Substance and Sexual Activity   Alcohol use: No    Alcohol/week: 0.0 standard drinks of alcohol   Drug use: No   Sexual activity: Yes  Other Topics Concern   Not on file  Social History Narrative   Not on file   Social Determinants of Health   Financial Resource Strain: Low Risk  (11/01/2022)   Overall Financial Resource Strain (CARDIA)    Difficulty of Paying Living Expenses: Not hard at all  Food Insecurity: No Food Insecurity (11/01/2022)   Hunger Vital Sign    Worried About Running Out of Food in the Last Year: Never true    Ran Out of Food in the Last Year: Never true  Transportation Needs: No Transportation Needs (11/01/2022)   PRAPARE - Administrator, Civil Service (Medical): No    Lack of Transportation (Non-Medical): No  Physical Activity: Inactive (11/01/2022)   Exercise Vital Sign    Days of Exercise per Week: 0 days    Minutes of Exercise per Session: 0 min  Stress: No Stress Concern Present (11/01/2022)   [employer] of Occupational Health - Occupational Stress Questionnaire    Feeling of Stress : Only a little  Social Connections: Socially Isolated (11/01/2022)   Social Connection and Isolation Panel [NHANES]    Frequency of Communication with Friends and Family: More than three times a week    Frequency of Social Gatherings with Friends and Family: More than three times a week    Attends Religious Services: Never    Database administrator or Organizations: No    Attends Banker Meetings: Never    Marital Status: Widowed    Tobacco Counseling Ready to quit: Not Answered Counseling  given: Not Answered Tobacco comments: 0.75ppd currently   Clinical Intake:  Pre-visit preparation completed: Yes  Pain : No/denies pain     Nutritional Risks: None Diabetes: Yes CBG done?: No Did pt. bring in CBG monitor from home?: No  How often do you need to have someone help you when you read instructions, pamphlets, or other written materials from your doctor or pharmacy?: 1 - Never  Diabetic?yes Nutrition Risk Assessment:  Has the patient had any N/V/D within the last 2 months?  No  Does the patient have any  non-healing wounds?  No  Has the patient had any unintentional weight loss or weight gain?  No   Diabetes:  Is the patient diabetic?  Yes  If diabetic, was a CBG obtained today?  No  Did the patient bring in their glucometer from home?  No  How often do you monitor your CBG's? never.   Financial Strains and Diabetes Management:  Are you having any financial strains with the device, your supplies or your medication? No .  Does the patient want to be seen by Chronic Care Management for management of their diabetes?  No  Would the patient like to be referred to a Nutritionist or for Diabetic Management?  No   Diabetic Exams:  Diabetic Eye Exam: Completed 8 weeks ago. Pt has been advised about the importance in completing this exam.  Diabetic Foot Exam: Completed 08/30/22. Pt has been advised about the importance in completing this exam.     Interpreter Needed?: No  Information entered by :: Kennedy Bucker, LPN   Activities of Daily Living    11/01/2022    2:36 PM 10/09/2022    7:25 AM  In your present state of health, do you have any difficulty performing the following activities:  Hearing? 0 0  Vision? 0 0  Difficulty concentrating or making decisions? 0 0  Walking or climbing stairs? 0 0  Dressing or bathing? 0 0  Doing errands, shopping? 0   Preparing Food and eating ? N   Using the Toilet? N   In the past six months, have you accidently leaked  urine? N   Do you have problems with loss of bowel control? N   Managing your Medications? N   Managing your Finances? N   Housekeeping or managing your Housekeeping? N     Patient Care Team: Reubin Milan, MD as PCP - General (Internal Medicine) [place] Crane, MD as Consulting Physician (Ophthalmology) Midge Minium, MD as Consulting Physician (Gastroenterology) Bud Face, MD as Referring Physician (Otolaryngology)  Indicate any recent Medical Services you may have received from other than [facility] providers in the past year (date may be approximate).     Assessment:   This is a routine wellness examination for Lawanda.  Hearing/Vision screen Hearing Screening - Comments:: No aids Vision Screening - Comments:: Wears glasses- Dr.King  Dietary issues and exercise activities discussed: Current Exercise Habits: The patient does not participate in regular exercise at present, Exercise limited by: None identified   Goals Addressed             This Visit's Progress    DIET - EAT MORE FRUITS AND VEGETABLES         Depression Screen    11/01/2022    2:33 PM 08/30/2022   10:07 AM 10/20/2021    9:54 AM 04/22/2021    9:32 AM 02/11/2021    3:49 PM 12/14/2020    8:17 AM 08/20/2020    9:38 AM  PHQ 2/9 Scores  PHQ - 2 Score 0 0 2 2 0 0 0  PHQ- 9 Score 0 0 5 5 0 0 0    Fall Risk    11/01/2022    2:35 PM 08/30/2022   10:07 AM 10/20/2021    9:55 AM 04/22/2021    9:33 AM 02/11/2021    3:50 PM  Fall Risk   Falls in the past year? 1 1 0 0 0  Number falls in past yr: 0 1 0 0 0  Injury with Fall?  0 1 0 0 0  Comment twisted knee      Risk for fall due to : History of fall(s) History of fall(s) No Fall Risks No Fall Risks No Fall Risks  Follow up Falls prevention discussed;Falls evaluation completed Falls evaluation completed Falls evaluation completed Falls evaluation completed Falls evaluation completed    FALL RISK PREVENTION PERTAINING TO THE HOME:  Any stairs in or  around the home? Yes  If so, are there any without handrails? No  Home free of loose throw rugs in walkways, pet beds, electrical cords, etc? Yes  Adequate lighting in your home to reduce risk of falls? Yes   ASSISTIVE DEVICES UTILIZED TO PREVENT FALLS:  Life alert? Yes  Use of a cane, walker or w/c? No  Grab bars in the bathroom? Yes  Shower chair or bench in shower? No  Elevated toilet seat or a handicapped toilet? Yes   TIMED UP AND GO:  Was the test performed? Yes .  Length of time to ambulate 10 feet: 4 sec.   Gait steady and fast without use of assistive device  Cognitive Function:        11/01/2022    2:51 PM  6CIT Screen  What Year? 0 points  What month? 0 points  What time? 0 points  Count back from 20 0 points  Months in reverse 0 points  Repeat phrase 0 points  Total Score 0 points    Immunizations Immunization History  Administered Date(s) Administered   Influenza,inj,Quad PF,6+ Mos 04/15/2018, 04/17/2019, 04/20/2020   Influenza-Unspecified 03/30/2017, 04/12/2021, 04/03/2022   PFIZER(Purple Top)SARS-COV-2 Vaccination 09/18/2019, 10/14/2019, 05/27/2020   PNEUMOCOCCAL CONJUGATE-20 04/22/2021   Tdap 04/17/2013   Zoster Recombinat (Shingrix) 05/29/2019, 08/27/2019    TDAP status: Up to date  Flu Vaccine status: Up to date  Pneumococcal vaccine status: Up to date  Covid-19 vaccine status: Completed vaccines  Qualifies for Shingles Vaccine? Yes   Zostavax completed No   Shingrix Completed?: Yes  Screening Tests Health Maintenance  Topic Date Due   OPHTHALMOLOGY EXAM  Never done   COVID-19 Vaccine (4 - 2023-24 season) 02/10/2022   Lung Cancer Screening  12/29/2022   INFLUENZA VACCINE  01/11/2023   HEMOGLOBIN A1C  03/03/2023   DTaP/Tdap/Td (2 - Td or Tdap) 04/18/2023   MAMMOGRAM  05/11/2023   FOOT EXAM  08/30/2023   Diabetic kidney evaluation - eGFR measurement  08/31/2023   Diabetic kidney evaluation - Urine ACR  08/31/2023   Medicare Annual  Wellness (AWV)  11/01/2023   COLONOSCOPY (Pts 45-14yrs Insurance coverage will need to be confirmed)  10/09/2027   Pneumonia Vaccine 69+ Years old  Completed   DEXA SCAN  Completed   Zoster Vaccines- Shingrix  Completed   Hepatitis C Screening  Addressed   HPV VACCINES  Aged Out    Health Maintenance  Health Maintenance Due  Topic Date Due   OPHTHALMOLOGY EXAM  Never done   COVID-19 Vaccine (4 - 2023-24 season) 02/10/2022    Colorectal cancer screening: Type of screening: Colonoscopy. Completed 10/09/22. Repeat every 5 years  Mammogram status: Ordered 08/30/22. Pt provided with contact info and advised to call to schedule appt.   Bone Density status: Completed 06/30/21. Results reflect: Bone density results: OSTEOPENIA. Repeat every 5 years.  Lung Cancer Screening: (Low Dose CT Chest recommended if Age 27-80 years, 30 pack-year currently smoking OR have quit w/in 15years.) does qualify.   Lung Cancer Screening Referral: ordered 08/30/22  Additional Screening:  Hepatitis C Screening:  does qualify; Completed 04/10/16  Vision Screening: Recommended annual ophthalmology exams for early detection of glaucoma and other disorders of the eye. Is the patient up to date with their annual eye exam?  Yes  Who is the provider or what is the name of the office in which the patient attends annual eye exams? Dr.King If pt is not established with a provider, would they like to be referred to a provider to establish care? No .   Dental Screening: Recommended annual dental exams for proper oral hygiene  Community Resource Referral / Chronic Care Management: CRR required this visit?  No   CCM required this visit?  No      Plan:     I have personally reviewed and noted the following in the patient's chart:   Medical and social history Use of alcohol, tobacco or illicit drugs  Current medications and supplements including opioid prescriptions. Patient is not currently taking opioid  prescriptions. Functional ability and status Nutritional status Physical activity Advanced directives List of other physicians Hospitalizations, surgeries, and ER visits in previous 12 months Vitals Screenings to include cognitive, depression, and falls Referrals and appointments  In addition, I have reviewed and discussed with patient certain preventive protocols, quality metrics, and best practice recommendations. A written personalized care plan for preventive services as well as general preventive health recommendations were provided to patient.     Hal Hope, LPN   1/61/0960   Nurse Notes: none

## 2022-11-27 ENCOUNTER — Ambulatory Visit: Payer: Medicare Other | Admitting: Internal Medicine

## 2022-11-27 NOTE — Progress Notes (Deleted)
Date:  11/27/2022   Name:  Diamond Jackson   DOB:  Jan 29, 1956   MRN:  [id number]   Chief Complaint: No chief complaint on file.  Diabetes She presents for her follow-up diabetic visit. She has type 2 diabetes mellitus. Pertinent negatives for hypoglycemia include no headaches or tremors. Pertinent negatives for diabetes include no chest pain, no fatigue, no polydipsia and no polyuria. Current diabetic treatment includes oral agent (monotherapy) (metformin).    Lab Results  Component Value Date   NA 143 08/31/2022   K 4.6 08/31/2022   CO2 21 08/31/2022   GLUCOSE 128 (H) 08/31/2022   BUN 14 08/31/2022   CREATININE 1.04 (H) 08/31/2022   CALCIUM 9.4 08/31/2022   EGFR 59 (L) 08/31/2022   GFRNONAA 68 04/20/2020   Lab Results  Component Value Date   CHOL 164 08/31/2022   HDL 38 (L) 08/31/2022   LDLCALC 94 08/31/2022   TRIG 187 (H) 08/31/2022   CHOLHDL 4.3 08/31/2022   Lab Results  Component Value Date   TSH 1.030 08/31/2022   Lab Results  Component Value Date   HGBA1C 7.3 (H) 08/31/2022   Lab Results  Component Value Date   WBC 8.6 08/31/2022   HGB 16.0 (H) 08/31/2022   HCT 47.7 (H) 08/31/2022   MCV 87 08/31/2022   PLT 256 08/31/2022   Lab Results  Component Value Date   ALT 21 08/31/2022   AST 14 08/31/2022   ALKPHOS 102 08/31/2022   BILITOT 0.4 08/31/2022   No results found for: "25OHVITD2", "25OHVITD3", "VD25OH"   Review of Systems  Constitutional:  Negative for appetite change, fatigue, fever and unexpected weight change.  HENT:  Negative for tinnitus and trouble swallowing.   Eyes:  Negative for visual disturbance.  Respiratory:  Negative for cough, chest tightness and shortness of breath.   Cardiovascular:  Negative for chest pain, palpitations and leg swelling.  Gastrointestinal:  Negative for abdominal pain.  Endocrine: Negative for polydipsia and polyuria.  Genitourinary:  Negative for dysuria and hematuria.  Musculoskeletal:  Negative for  arthralgias.  Neurological:  Negative for tremors, numbness and headaches.  Psychiatric/Behavioral:  Negative for dysphoric mood.     Patient Active Problem List   Diagnosis Date Noted   Hyperlipidemia associated with type 2 diabetes mellitus (HCC) 08/30/2022   Type II diabetes mellitus with complication (HCC) 10/20/2021   Coronary artery calcification seen on computed tomography 01/02/2021   Centrilobular emphysema (HCC) 06/01/2020   Aortic atherosclerosis (HCC) 06/01/2020   Personal history of colonic polyps    Polyp of transverse colon    Benign neoplasm of cecum    Benign neoplasm of transverse colon    Benign neoplasm of rectosigmoid junction    Environmental and seasonal allergies 03/22/2015   Fibrocystic breast changes 03/22/2015   Hx of peptic ulcer 03/22/2015   Current tobacco use 03/22/2015    No Known Allergies  Past Surgical History:  Procedure Laterality Date   ANKLE SURGERY Right 1984   BREAST BIOPSY Left    benign two areas/clips   CHOLECYSTECTOMY  2010   CLEFT PALATE REPAIR     as an infant   COLONOSCOPY  2010   normal   COLONOSCOPY WITH PROPOFOL N/A 05/17/2018   Procedure: COLONOSCOPY WITH BIOPSIES;  Surgeon: Midge Minium, MD;  Location: [facility];  Service: Endoscopy;  Laterality: N/A;   COLONOSCOPY WITH PROPOFOL N/A 09/29/2019   Procedure: COLONOSCOPY WITH BIOPSY;  Surgeon: Midge Minium, MD;  Location: [facility];  Service: Endoscopy;  Laterality: N/A;  priority 3   COLONOSCOPY WITH PROPOFOL N/A 10/09/2022   Procedure: COLONOSCOPY WITH PROPOFOL;  Surgeon: Midge Minium, MD;  Location: [facility];  Service: Endoscopy;  Laterality: N/A;   POLYPECTOMY  05/17/2018   Procedure: POLYPECTOMY;  Surgeon: Midge Minium, MD;  Location: [facility];  Service: Endoscopy;;   POLYPECTOMY N/A 09/29/2019   Procedure: POLYPECTOMY;  Surgeon: Midge Minium, MD;  Location: [facility];  Service: Endoscopy;  Laterality: N/A;  Clips x 2  placed at site of Hepatic Flexure Polyp site removal   POLYPECTOMY  10/09/2022   Procedure: POLYPECTOMY;  Surgeon: Midge Minium, MD;  Location: [facility];  Service: Endoscopy;;    Social History   Tobacco Use   Smoking status: Every Day    Packs/day: 1.50    Years: 48.00    Additional pack years: 0.00    Total pack years: 72.00    Types: Cigarettes   Smokeless tobacco: Never   Tobacco comments:    0.75ppd currently  Vaping Use   Vaping Use: Never used  Substance Use Topics   Alcohol use: No    Alcohol/week: 0.0 standard drinks of alcohol   Drug use: No     Medication list has been reviewed and updated.  No outpatient medications have been marked as taking for the 11/27/22 encounter (Appointment) with Reubin Milan, MD.       08/30/2022   10:07 AM 10/20/2021    9:55 AM 04/22/2021    9:32 AM 02/11/2021    3:49 PM  GAD 7 : Generalized Anxiety Score  Nervous, Anxious, on Edge 0 1 0 0  Control/stop worrying 0 1 0 0  Worry too much - different things 0 0 0 0  Trouble relaxing 0 0 0 0  Restless 0 1 0 0  Easily annoyed or irritable 0 1 0 0  Afraid - awful might happen 0 0 0 0  Total GAD 7 Score 0 4 0 0  Anxiety Difficulty Not difficult at all Not difficult at all Not difficult at all Not difficult at all       11/01/2022    2:33 PM 08/30/2022   10:07 AM 10/20/2021    9:54 AM  Depression screen PHQ 2/9  Decreased Interest 0 0 1  Down, Depressed, Hopeless 0 0 1  PHQ - 2 Score 0 0 2  Altered sleeping 0 0 0  Tired, decreased energy 0 0 1  Change in appetite 0 0 2  Feeling bad or failure about yourself  0 0 0  Trouble concentrating 0 0 0  Moving slowly or fidgety/restless 0 0 0  Suicidal thoughts 0 0 0  PHQ-9 Score 0 0 5  Difficult doing work/chores Not difficult at all Not difficult at all Not difficult at all    BP Readings from Last 3 Encounters:  11/01/22 118/62  10/09/22 110/74  08/30/22 112/66    Physical Exam Vitals and nursing note reviewed.   Constitutional:      General: She is not in acute distress.    Appearance: She is well-developed.  HENT:     Head: Normocephalic and atraumatic.  Pulmonary:     Effort: Pulmonary effort is normal. No respiratory distress.  Skin:    General: Skin is warm and dry.     Findings: No rash.  Neurological:     Mental Status: She is alert and oriented to person, place, and time.  Psychiatric:  Mood and Affect: Mood normal.        Behavior: Behavior normal.     Wt Readings from Last 3 Encounters:  11/01/22 208 lb 3.2 oz (94.4 kg)  10/09/22 203 lb 1.6 oz (92.1 kg)  08/30/22 209 lb 6.4 oz (95 kg)    There were no vitals taken for this visit.  Assessment and Plan:  Problem List Items Addressed This Visit     Type II diabetes mellitus with complication (HCC) - Primary (Chronic)    Blood sugars stable without hypoglycemic symptoms or events. Currently being treated with metformin that was started last visit. Lab Results  Component Value Date   HGBA1C 7.3 (H) 08/31/2022        Other Visit Diagnoses     Long term current use of oral hypoglycemic drug           No follow-ups on file.   Partially dictated using Dragon software, any errors are not intentional.  Reubin Milan, MD [facility] [place], [place]

## 2022-11-27 NOTE — Assessment & Plan Note (Deleted)
Blood sugars stable without hypoglycemic symptoms or events. Currently being treated with metformin that was started last visit. Lab Results  Component Value Date   HGBA1C 7.3 (H) 08/31/2022

## 2022-11-28 ENCOUNTER — Encounter: Payer: Self-pay | Admitting: Internal Medicine

## 2023-01-01 ENCOUNTER — Inpatient Hospital Stay: Admission: RE | Admit: 2023-01-01 | Payer: Medicare Other | Source: Ambulatory Visit

## 2023-01-11 DIAGNOSIS — E119 Type 2 diabetes mellitus without complications: Secondary | ICD-10-CM | POA: Diagnosis not present

## 2023-01-22 ENCOUNTER — Inpatient Hospital Stay: Admission: RE | Admit: 2023-01-22 | Payer: Medicare Other | Source: Ambulatory Visit

## 2023-01-22 ENCOUNTER — Other Ambulatory Visit: Payer: Self-pay | Admitting: Internal Medicine

## 2023-01-22 ENCOUNTER — Inpatient Hospital Stay
Admission: RE | Admit: 2023-01-22 | Discharge: 2023-01-22 | Disposition: A | Discharge status: Home / Self Care | Payer: Medicare Other | Source: Ambulatory Visit | Attending: Internal Medicine | Admitting: Internal Medicine

## 2023-01-22 DIAGNOSIS — Z87891 Personal history of nicotine dependence: Secondary | ICD-10-CM | POA: Diagnosis not present

## 2023-01-22 DIAGNOSIS — F17210 Nicotine dependence, cigarettes, uncomplicated: Secondary | ICD-10-CM

## 2023-01-22 DIAGNOSIS — Z122 Encounter for screening for malignant neoplasm of respiratory organs: Secondary | ICD-10-CM

## 2023-02-11 DIAGNOSIS — E119 Type 2 diabetes mellitus without complications: Secondary | ICD-10-CM | POA: Diagnosis not present

## 2023-03-06 ENCOUNTER — Ambulatory Visit: Payer: 59 | Admitting: Internal Medicine

## 2023-03-11 ENCOUNTER — Other Ambulatory Visit: Payer: Self-pay | Admitting: Internal Medicine

## 2023-03-11 DIAGNOSIS — I700 Atherosclerosis of aorta: Secondary | ICD-10-CM

## 2023-03-11 DIAGNOSIS — E118 Type 2 diabetes mellitus with unspecified complications: Secondary | ICD-10-CM

## 2023-03-12 NOTE — Telephone Encounter (Signed)
Appointment made

## 2023-03-13 DIAGNOSIS — E119 Type 2 diabetes mellitus without complications: Secondary | ICD-10-CM | POA: Diagnosis not present

## 2023-03-26 ENCOUNTER — Other Ambulatory Visit: Payer: Self-pay | Admitting: Internal Medicine

## 2023-03-26 DIAGNOSIS — I2510 Atherosclerotic heart disease of native coronary artery without angina pectoris: Secondary | ICD-10-CM | POA: Diagnosis not present

## 2023-03-26 DIAGNOSIS — E669 Obesity, unspecified: Secondary | ICD-10-CM | POA: Diagnosis not present

## 2023-03-26 DIAGNOSIS — I700 Atherosclerosis of aorta: Secondary | ICD-10-CM | POA: Diagnosis not present

## 2023-03-26 DIAGNOSIS — Z720 Tobacco use: Secondary | ICD-10-CM | POA: Diagnosis not present

## 2023-03-27 ENCOUNTER — Ambulatory Visit
Admission: RE | Admit: 2023-03-27 | Discharge: 2023-03-27 | Disposition: A | Discharge status: Home / Self Care | Payer: Medicare Other | Source: Ambulatory Visit | Attending: Internal Medicine | Admitting: Internal Medicine

## 2023-03-27 DIAGNOSIS — I2510 Atherosclerotic heart disease of native coronary artery without angina pectoris: Secondary | ICD-10-CM | POA: Insufficient documentation

## 2023-03-28 ENCOUNTER — Ambulatory Visit: Payer: Medicare Other | Admitting: Internal Medicine

## 2023-03-28 ENCOUNTER — Encounter: Payer: Self-pay | Admitting: Internal Medicine

## 2023-03-28 VITALS — BP 104/60 | HR 59 | Ht 65.0 in | Wt 196.0 lb

## 2023-03-28 DIAGNOSIS — E118 Type 2 diabetes mellitus with unspecified complications: Secondary | ICD-10-CM

## 2023-03-28 DIAGNOSIS — Z23 Encounter for immunization: Secondary | ICD-10-CM | POA: Diagnosis not present

## 2023-03-28 DIAGNOSIS — Z7984 Long term (current) use of oral hypoglycemic drugs: Secondary | ICD-10-CM

## 2023-03-28 NOTE — Progress Notes (Signed)
Date:  03/28/2023   Name:  Diamond Jackson   DOB:  29-Mar-1956   MRN:  [id number]   Chief Complaint: Diabetes  Diabetes She presents for her follow-up diabetic visit. She has type 2 diabetes mellitus. Pertinent negatives for hypoglycemia include no headaches or tremors. Pertinent negatives for diabetes include no chest pain, no fatigue, no polydipsia and no polyuria. Current diabetic treatment includes oral agent (monotherapy) (metformin added last visit). She is compliant with treatment all of the time. An ACE inhibitor/angiotensin II receptor blocker is not being taken. Eye exam is not current.    Review of Systems  Constitutional:  Negative for appetite change, fatigue, fever and unexpected weight change.  HENT:  Negative for tinnitus and trouble swallowing.   Eyes:  Negative for visual disturbance.  Respiratory:  Negative for cough, chest tightness and shortness of breath.   Cardiovascular:  Negative for chest pain, palpitations and leg swelling.  Gastrointestinal:  Negative for abdominal pain.  Endocrine: Negative for polydipsia and polyuria.  Genitourinary:  Negative for dysuria and hematuria.  Musculoskeletal:  Negative for arthralgias.  Neurological:  Negative for tremors, numbness and headaches.  Psychiatric/Behavioral:  Negative for dysphoric mood.      Lab Results  Component Value Date   NA 143 08/31/2022   K 4.6 08/31/2022   CO2 21 08/31/2022   GLUCOSE 128 (H) 08/31/2022   BUN 14 08/31/2022   CREATININE 1.04 (H) 08/31/2022   CALCIUM 9.4 08/31/2022   EGFR 59 (L) 08/31/2022   GFRNONAA 68 04/20/2020   Lab Results  Component Value Date   CHOL 164 08/31/2022   HDL 38 (L) 08/31/2022   LDLCALC 94 08/31/2022   TRIG 187 (H) 08/31/2022   CHOLHDL 4.3 08/31/2022   Lab Results  Component Value Date   TSH 1.030 08/31/2022   Lab Results  Component Value Date   HGBA1C 7.3 (H) 08/31/2022   Lab Results  Component Value Date   WBC 8.6 08/31/2022   HGB 16.0 (H)  08/31/2022   HCT 47.7 (H) 08/31/2022   MCV 87 08/31/2022   PLT 256 08/31/2022   Lab Results  Component Value Date   ALT 21 08/31/2022   AST 14 08/31/2022   ALKPHOS 102 08/31/2022   BILITOT 0.4 08/31/2022   No results found for: "25OHVITD2", "25OHVITD3", "VD25OH"   Patient Active Problem List   Diagnosis Date Noted   Hyperlipidemia associated with type 2 diabetes mellitus (HCC) 08/30/2022   Type II diabetes mellitus with complication (HCC) 10/20/2021   Coronary artery calcification seen on computed tomography 01/02/2021   Centrilobular emphysema (HCC) 06/01/2020   Aortic atherosclerosis (HCC) 06/01/2020   History of colonic polyps    Polyp of transverse colon    Benign neoplasm of cecum    Benign neoplasm of transverse colon    Benign neoplasm of rectosigmoid junction    Environmental and seasonal allergies 03/22/2015   Fibrocystic breast changes 03/22/2015   Hx of peptic ulcer 03/22/2015   Current tobacco use 03/22/2015    No Known Allergies  Past Surgical History:  Procedure Laterality Date   ANKLE SURGERY Right 1984   BREAST BIOPSY Left    benign two areas/clips   CHOLECYSTECTOMY  2010   CLEFT PALATE REPAIR     as an infant   COLONOSCOPY  2010   normal   COLONOSCOPY WITH PROPOFOL N/A 05/17/2018   Procedure: COLONOSCOPY WITH BIOPSIES;  Surgeon: Midge Minium, MD;  Location: [facility];  Service: Endoscopy;  Laterality:  N/A;   COLONOSCOPY WITH PROPOFOL N/A 09/29/2019   Procedure: COLONOSCOPY WITH BIOPSY;  Surgeon: Midge Minium, MD;  Location: [facility];  Service: Endoscopy;  Laterality: N/A;  priority 3   COLONOSCOPY WITH PROPOFOL N/A 10/09/2022   Procedure: COLONOSCOPY WITH PROPOFOL;  Surgeon: Midge Minium, MD;  Location: [facility];  Service: Endoscopy;  Laterality: N/A;   POLYPECTOMY  05/17/2018   Procedure: POLYPECTOMY;  Surgeon: Midge Minium, MD;  Location: [facility];  Service: Endoscopy;;   POLYPECTOMY N/A 09/29/2019    Procedure: POLYPECTOMY;  Surgeon: Midge Minium, MD;  Location: [facility];  Service: Endoscopy;  Laterality: N/A;  Clips x 2 placed at site of Hepatic Flexure Polyp site removal   POLYPECTOMY  10/09/2022   Procedure: POLYPECTOMY;  Surgeon: Midge Minium, MD;  Location: [facility];  Service: Endoscopy;;    Social History   Tobacco Use   Smoking status: Every Day    Current packs/day: 1.50    Average packs/day: 1.5 packs/day for 48.0 years (72.0 ttl pk-yrs)    Types: Cigarettes   Smokeless tobacco: Never   Tobacco comments:    0.75ppd currently  Vaping Use   Vaping status: Never Used  Substance Use Topics   Alcohol use: No    Alcohol/week: 0.0 standard drinks of alcohol   Drug use: No     Medication list has been reviewed and updated.  Current Meds  Medication Sig   albuterol (VENTOLIN HFA) 108 (90 Base) MCG/ACT inhaler INHALE 1 TO 2 PUFFS BY MOUTH EVERY 6 HOURS AS NEEDED FOR WHEEZING FOR SHORTNESS OF BREATH   ANORO ELLIPTA 62.5-25 MCG/ACT AEPB 1 puff daily.   atorvastatin (LIPITOR) 10 MG tablet Take 1 tablet by mouth once daily   blood glucose meter kit and supplies Dispense based on patient and insurance preference. Use up to four times daily as directed. (FOR ICD-10 E10.9, E11.9).   cetirizine (ZYRTEC) 10 MG tablet Take 10 mg by mouth daily as needed for allergies.   metFORMIN (GLUCOPHAGE-XR) 500 MG 24 hr tablet Take 1 tablet by mouth once daily with breakfast   OPCON-A 0.027-0.315 % SOLN For eyes   pantoprazole (PROTONIX) 40 MG tablet Take 1 tablet (40 mg total) by mouth every morning.       03/28/2023    4:06 PM 08/30/2022   10:07 AM 10/20/2021    9:55 AM 04/22/2021    9:32 AM  GAD 7 : Generalized Anxiety Score  Nervous, Anxious, on Edge 0 0 1 0  Control/stop worrying 0 0 1 0  Worry too much - different things 0 0 0 0  Trouble relaxing 0 0 0 0  Restless 0 0 1 0  Easily annoyed or irritable 0 0 1 0  Afraid - awful might happen 0 0 0 0  Total GAD 7  Score 0 0 4 0  Anxiety Difficulty Not difficult at all Not difficult at all Not difficult at all Not difficult at all       03/28/2023    4:06 PM 11/01/2022    2:33 PM 08/30/2022   10:07 AM  Depression screen PHQ 2/9  Decreased Interest 0 0 0  Down, Depressed, Hopeless 0 0 0  PHQ - 2 Score 0 0 0  Altered sleeping 0 0 0  Tired, decreased energy 0 0 0  Change in appetite 0 0 0  Feeling bad or failure about yourself  0 0 0  Trouble concentrating 0 0 0  Moving slowly or  fidgety/restless 0 0 0  Suicidal thoughts 0 0 0  PHQ-9 Score 0 0 0  Difficult doing work/chores Not difficult at all Not difficult at all Not difficult at all    BP Readings from Last 3 Encounters:  03/28/23 104/60  11/01/22 118/62  10/09/22 110/74    Physical Exam Vitals and nursing note reviewed.  Constitutional:      General: She is not in acute distress.    Appearance: She is well-developed.  HENT:     Head: Normocephalic and atraumatic.  Neck:     Vascular: No carotid bruit.  Cardiovascular:     Rate and Rhythm: Normal rate.     Heart sounds: No murmur heard. Pulmonary:     Effort: Pulmonary effort is normal. No respiratory distress.     Breath sounds: No wheezing or rhonchi.  Musculoskeletal:     Cervical back: Normal range of motion.     Right lower leg: No edema.     Left lower leg: No edema.  Lymphadenopathy:     Cervical: No cervical adenopathy.  Skin:    General: Skin is warm and dry.     Findings: No rash.  Neurological:     General: No focal deficit present.     Mental Status: She is alert and oriented to person, place, and time.  Psychiatric:        Mood and Affect: Mood normal.        Behavior: Behavior normal.     Wt Readings from Last 3 Encounters:  03/28/23 196 lb (88.9 kg)  11/01/22 208 lb 3.2 oz (94.4 kg)  10/09/22 203 lb 1.6 oz (92.1 kg)    BP 104/60   Pulse (!) 59   Ht 5\' 5"  (1.651 m)   Wt 196 lb (88.9 kg)   SpO2 99%   BMI 32.62 kg/m   Assessment and  Plan:  Problem List Items Addressed This Visit       Unprioritized   Type II diabetes mellitus with complication (HCC) - Primary (Chronic)    A1C higher last visit so metformin once daily was added. She is now exercising and has lost almost 15 lbs.  She has noticed left sided tongue and lip tingling for an hour after taking metformin. BS at home range around 100. Will check BMP and A1C today. Stop metformin and monitor for allergic type reaction to resolve       Relevant Orders   Comprehensive metabolic panel   Hemoglobin A1c   Other Visit Diagnoses     Long term current use of oral hypoglycemic drug       Need for influenza vaccination       Relevant Orders   Flu Vaccine Trivalent High Dose (Fluad) (Completed)       No follow-ups on file.    Reubin Milan, MD [facility] Primary [facility] Medicine [place]

## 2023-03-28 NOTE — Assessment & Plan Note (Addendum)
A1C higher last visit so metformin once daily was added. She is now exercising and has lost almost 15 lbs.  She has noticed left sided tongue and lip tingling for an hour after taking metformin. BS at home range around 100. Will check BMP and A1C today. Stop metformin and monitor for allergic type reaction to resolve

## 2023-03-29 LAB — COMPREHENSIVE METABOLIC PANEL
ALT: 15 [IU]/L (ref 0–32)
AST: 15 [IU]/L (ref 0–40)
Albumin: 4.4 g/dL (ref 3.9–4.9)
Alkaline Phosphatase: 89 [IU]/L (ref 44–121)
BUN/Creatinine Ratio: 12 (ref 12–28)
BUN: 12 mg/dL (ref 8–27)
Bilirubin Total: 0.3 mg/dL (ref 0.0–1.2)
CO2: 21 mmol/L (ref 20–29)
Calcium: 9.6 mg/dL (ref 8.7–10.3)
Chloride: 104 mmol/L (ref 96–106)
Creatinine, Ser: 1.03 mg/dL — ABNORMAL HIGH (ref 0.57–1.00)
Globulin, Total: 2.4 g/dL (ref 1.5–4.5)
Glucose: 93 mg/dL (ref 70–99)
Potassium: 4.2 mmol/L (ref 3.5–5.2)
Sodium: 141 mmol/L (ref 134–144)
Total Protein: 6.8 g/dL (ref 6.0–8.5)
eGFR: 60 mL/min/{1.73_m2} (ref >59)

## 2023-03-29 LAB — HEMOGLOBIN A1C
Est. average glucose Bld gHb Est-mCnc: 131 mg/dL
Hgb A1c MFr Bld: 6.2 % — ABNORMAL HIGH (ref 4.8–5.6)

## 2023-04-13 DIAGNOSIS — E119 Type 2 diabetes mellitus without complications: Secondary | ICD-10-CM | POA: Diagnosis not present

## 2023-04-27 DIAGNOSIS — J449 Chronic obstructive pulmonary disease, unspecified: Secondary | ICD-10-CM | POA: Diagnosis not present

## 2023-04-27 DIAGNOSIS — K219 Gastro-esophageal reflux disease without esophagitis: Secondary | ICD-10-CM | POA: Diagnosis not present

## 2023-04-27 DIAGNOSIS — J302 Other seasonal allergic rhinitis: Secondary | ICD-10-CM | POA: Diagnosis not present

## 2023-04-27 DIAGNOSIS — F17210 Nicotine dependence, cigarettes, uncomplicated: Secondary | ICD-10-CM | POA: Diagnosis not present

## 2023-05-13 DIAGNOSIS — E119 Type 2 diabetes mellitus without complications: Secondary | ICD-10-CM | POA: Diagnosis not present

## 2023-05-14 ENCOUNTER — Inpatient Hospital Stay: Admission: RE | Admit: 2023-05-14 | Payer: Medicare Other | Source: Ambulatory Visit

## 2023-06-13 DIAGNOSIS — E119 Type 2 diabetes mellitus without complications: Secondary | ICD-10-CM | POA: Diagnosis not present

## 2023-06-18 ENCOUNTER — Other Ambulatory Visit: Payer: Self-pay | Admitting: Internal Medicine

## 2023-06-18 DIAGNOSIS — Z720 Tobacco use: Secondary | ICD-10-CM

## 2023-06-20 ENCOUNTER — Other Ambulatory Visit: Payer: Self-pay | Admitting: Internal Medicine

## 2023-06-20 DIAGNOSIS — I700 Atherosclerosis of aorta: Secondary | ICD-10-CM

## 2023-06-21 NOTE — Telephone Encounter (Signed)
 Requested Prescriptions  Pending Prescriptions Disp Refills   albuterol  (VENTOLIN  HFA) 108 (90 Base) MCG/ACT inhaler [Pharmacy Med Name: Albuterol  Sulfate HFA 108 (90 Base) MCG/ACT Inhalation Aerosol Solution] 18 g 0    Sig: INHALE 1 TO 2 PUFFS BY MOUTH EVERY 6 HOURS AS NEEDED FOR WHEEZING OR SHORTNESS OF BREATH     Pulmonology:  Beta Agonists 2 Passed - 06/21/2023  9:28 AM      Passed - Last BP in normal range    BP Readings from Last 1 Encounters:  03/28/23 104/60         Passed - Last Heart Rate in normal range    Pulse Readings from Last 1 Encounters:  03/28/23 (!) 59         Passed - Valid encounter within last 12 months    Recent Outpatient Visits           2 months ago Type II diabetes mellitus with complication (HCC)   [place] [facility] at [facility], Leita DEL, MD   9 months ago Annual physical exam   [facility] Primary [facility] Medicine at [facility], Leita DEL, MD   1 year ago Type II diabetes mellitus with complication [facility])   [place] [facility] & Sports Medicine at [facility], Leita DEL, MD   2 years ago Annual physical exam   [facility] Primary Care & Sports Medicine at [facility], Leita DEL, MD   2 years ago Left genital labial abscess   [facility] Primary [facility] & Sports Medicine at [facility], Leita DEL, MD       Future Appointments             In 1 month Adie, Leita DEL, MD [facility] Primary Care & Sports Medicine at [facility], [facility]   In 2 months Adie, Leita DEL, MD [facility] Primary Care & Sports Medicine at [facility], [facility]

## 2023-06-22 NOTE — Telephone Encounter (Signed)
 Requested Prescriptions  Pending Prescriptions Disp Refills   atorvastatin  (LIPITOR) 10 MG tablet [Pharmacy Med Name: Atorvastatin  Calcium  10 MG Oral Tablet] 90 tablet 0    Sig: Take 1 tablet by mouth once daily     Cardiovascular:  Antilipid - Statins Failed - 06/22/2023  8:08 PM      Failed - Lipid Panel in normal range within the last 12 months    Cholesterol, Total  Date Value Ref Range Status  08/31/2022 164 100 - 199 mg/dL Final   LDL Chol Calc (NIH)  Date Value Ref Range Status  08/31/2022 94 0 - 99 mg/dL Final   HDL  Date Value Ref Range Status  08/31/2022 38 (L) >39 mg/dL Final   Triglycerides  Date Value Ref Range Status  08/31/2022 187 (H) 0 - 149 mg/dL Final         Passed - Patient is not pregnant      Passed - Valid encounter within last 12 months    Recent Outpatient Visits           2 months ago Type II diabetes mellitus with complication [facility])   [place] [facility] at [facility], Leita DEL, MD   9 months ago Annual physical exam   [facility] Primary Care & Sports Medicine at [facility], Leita DEL, MD   1 year ago Type II diabetes mellitus with complication [facility])   [place] [facility] Medicine at [facility], Leita DEL, MD   2 years ago Annual physical exam   [facility] Primary Care & Sports Medicine at [facility], Leita DEL, MD   2 years ago Left genital labial abscess   [facility] Primary [facility] Medicine at [facility], Leita DEL, MD       Future Appointments             In 1 month Adie, Leita DEL, MD [facility] Primary Care & Sports Medicine at [facility], [facility]   In 2 months Adie, Leita DEL, MD [facility] Health Primary Care & Sports Medicine at [facility], [facility]

## 2023-06-25 DIAGNOSIS — I700 Atherosclerosis of aorta: Secondary | ICD-10-CM | POA: Diagnosis not present

## 2023-06-25 DIAGNOSIS — E669 Obesity, unspecified: Secondary | ICD-10-CM | POA: Diagnosis not present

## 2023-06-25 DIAGNOSIS — I2510 Atherosclerotic heart disease of native coronary artery without angina pectoris: Secondary | ICD-10-CM | POA: Diagnosis not present

## 2023-06-25 DIAGNOSIS — Z720 Tobacco use: Secondary | ICD-10-CM | POA: Diagnosis not present

## 2023-07-10 ENCOUNTER — Other Ambulatory Visit: Payer: Self-pay | Admitting: *Deleted

## 2023-07-10 DIAGNOSIS — Z122 Encounter for screening for malignant neoplasm of respiratory organs: Secondary | ICD-10-CM

## 2023-07-10 DIAGNOSIS — Z87891 Personal history of nicotine dependence: Secondary | ICD-10-CM

## 2023-07-10 DIAGNOSIS — F17210 Nicotine dependence, cigarettes, uncomplicated: Secondary | ICD-10-CM

## 2023-07-14 DIAGNOSIS — E119 Type 2 diabetes mellitus without complications: Secondary | ICD-10-CM | POA: Diagnosis not present

## 2023-07-24 ENCOUNTER — Encounter: Payer: Self-pay | Admitting: Internal Medicine

## 2023-07-24 ENCOUNTER — Ambulatory Visit: Payer: Medicare Other | Admitting: Internal Medicine

## 2023-07-24 VITALS — BP 116/70 | HR 74 | Temp 98.0°F | Ht 65.0 in | Wt 193.0 lb

## 2023-07-24 DIAGNOSIS — J0100 Acute maxillary sinusitis, unspecified: Secondary | ICD-10-CM | POA: Diagnosis not present

## 2023-07-24 DIAGNOSIS — J432 Centrilobular emphysema: Secondary | ICD-10-CM | POA: Diagnosis not present

## 2023-07-24 MED ORDER — AMOXICILLIN-POT CLAVULANATE 875-125 MG PO TABS
1.0000 | ORAL_TABLET | Freq: Two times a day (BID) | ORAL | 0 refills | Status: AC
Start: 2023-07-24 — End: 2023-08-03

## 2023-07-24 NOTE — Assessment & Plan Note (Signed)
Continue Anoro - use albuterol more liberally while treating infection

## 2023-07-24 NOTE — Patient Instructions (Signed)
Sudafed 30 mg twice a day for 5 days.  Continue Flonase  Continue Anoro daily and use albuterol at least twice a day.

## 2023-07-24 NOTE — Progress Notes (Signed)
Date:  07/24/2023   Name:  SAGA BALTHAZAR   DOB:  05-14-1956   MRN:  [id number]   Chief Complaint: Ear Pain (Lt ear pain. Pain started today.) and Sinusitis (X 1 month patient having sinus congestion. No fever. No SOB. Cough- yellow in color.)  Sinusitis This is a new problem. The current episode started 1 to 4 weeks ago. There has been no fever. Associated symptoms include congestion, ear pain, shortness of breath and sinus pressure. Pertinent negatives include no chills, headaches, hoarse voice or sore throat. Past treatments include saline nose sprays. The treatment provided mild relief.    Review of Systems  Constitutional:  Negative for chills, fatigue and fever.  HENT:  Positive for congestion, ear pain and sinus pressure. Negative for hoarse voice and sore throat.   Eyes:  Negative for visual disturbance.  Respiratory:  Positive for shortness of breath and wheezing. Negative for chest tightness.   Cardiovascular:  Negative for chest pain and palpitations.  Neurological:  Negative for dizziness and headaches.  Psychiatric/Behavioral:  Negative for dysphoric mood and sleep disturbance. The patient is not nervous/anxious.      Lab Results  Component Value Date   NA 141 03/28/2023   K 4.2 03/28/2023   CO2 21 03/28/2023   GLUCOSE 93 03/28/2023   BUN 12 03/28/2023   CREATININE 1.03 (H) 03/28/2023   CALCIUM 9.6 03/28/2023   EGFR 60 03/28/2023   GFRNONAA 68 04/20/2020   Lab Results  Component Value Date   CHOL 164 08/31/2022   HDL 38 (L) 08/31/2022   LDLCALC 94 08/31/2022   TRIG 187 (H) 08/31/2022   CHOLHDL 4.3 08/31/2022   Lab Results  Component Value Date   TSH 1.030 08/31/2022   Lab Results  Component Value Date   HGBA1C 6.2 (H) 03/28/2023   Lab Results  Component Value Date   WBC 8.6 08/31/2022   HGB 16.0 (H) 08/31/2022   HCT 47.7 (H) 08/31/2022   MCV 87 08/31/2022   PLT 256 08/31/2022   Lab Results  Component Value Date   ALT 15 03/28/2023   AST 15  03/28/2023   ALKPHOS 89 03/28/2023   BILITOT 0.3 03/28/2023   No results found for: "25OHVITD2", "25OHVITD3", "VD25OH"   Patient Active Problem List   Diagnosis Date Noted   Hyperlipidemia associated with type 2 diabetes mellitus (HCC) 08/30/2022   Type II diabetes mellitus with complication (HCC) 10/20/2021   Coronary artery calcification seen on computed tomography 01/02/2021   Centrilobular emphysema (HCC) 06/01/2020   Aortic atherosclerosis (HCC) 06/01/2020   History of colonic polyps    Polyp of transverse colon    Benign neoplasm of cecum    Benign neoplasm of transverse colon    Benign neoplasm of rectosigmoid junction    Environmental and seasonal allergies 03/22/2015   Fibrocystic breast changes 03/22/2015   Hx of peptic ulcer 03/22/2015   Current tobacco use 03/22/2015    No Known Allergies  Past Surgical History:  Procedure Laterality Date   ANKLE SURGERY Right 1984   BREAST BIOPSY Left    benign two areas/clips   CHOLECYSTECTOMY  2010   CLEFT PALATE REPAIR     as an infant   COLONOSCOPY  2010   normal   COLONOSCOPY WITH PROPOFOL N/A 05/17/2018   Procedure: COLONOSCOPY WITH BIOPSIES;  Surgeon: Midge Minium, MD;  Location[facility];  Service: Endoscopy;  Laterality: N/A;   COLONOSCOPY WITH PROPOFOL N/A 09/29/2019   Procedure: COLONOSCOPY WITH  BIOPSY;  Surgeon: Midge Minium, MD;  Location[facility];  Service: Endoscopy;  Laterality: N/A;  priority 3   COLONOSCOPY WITH PROPOFOL N/A 10/09/2022   Procedure: COLONOSCOPY WITH PROPOFOL;  Surgeon: Midge Minium, MD;  Location: [facility];  Service: Endoscopy;  Laterality: N/A;   POLYPECTOMY  05/17/2018   Procedure: POLYPECTOMY;  Surgeon: Midge Minium, MD;  Location: [facility];  Service: Endoscopy;;   POLYPECTOMY N/A 09/29/2019   Procedure: POLYPECTOMY;  Surgeon: Midge Minium, MD;  Location: [facility];  Service: Endoscopy;  Laterality: N/A;  Clips x 2 placed at site of Hepatic  Flexure Polyp site removal   POLYPECTOMY  10/09/2022   Procedure: POLYPECTOMY;  Surgeon: Midge Minium, MD;  Location: [facility];  Service: Endoscopy;;    Social History   Tobacco Use   Smoking status: Every Day    Current packs/day: 1.50    Average packs/day: 1.5 packs/day for 48.0 years (72.0 ttl pk-yrs)    Types: Cigarettes   Smokeless tobacco: Never   Tobacco comments:    0.75ppd currently  Vaping Use   Vaping status: Never Used  Substance Use Topics   Alcohol use: No    Alcohol/week: 0.0 standard drinks of alcohol   Drug use: No     Medication list has been reviewed and updated.  Current Meds  Medication Sig   albuterol (VENTOLIN HFA) 108 (90 Base) MCG/ACT inhaler INHALE 1 TO 2 PUFFS BY MOUTH EVERY 6 HOURS AS NEEDED FOR WHEEZING OR SHORTNESS OF BREATH   amoxicillin-clavulanate (AUGMENTIN) 875-125 MG tablet Take 1 tablet by mouth 2 (two) times daily for 10 days.   ANORO ELLIPTA 62.5-25 MCG/ACT AEPB 1 puff daily.   atorvastatin (LIPITOR) 10 MG tablet Take 1 tablet by mouth once daily   blood glucose meter kit and supplies Dispense based on patient and insurance preference. Use up to four times daily as directed. (FOR ICD-10 E10.9, E11.9).   cetirizine (ZYRTEC) 10 MG tablet Take 10 mg by mouth daily as needed for allergies.   metFORMIN (GLUCOPHAGE-XR) 500 MG 24 hr tablet Take 1 tablet by mouth once daily with breakfast   OPCON-A 0.027-0.315 % SOLN For eyes   pantoprazole (PROTONIX) 40 MG tablet Take 1 tablet (40 mg total) by mouth every morning.       07/24/2023    4:10 PM 03/28/2023    4:06 PM 08/30/2022   10:07 AM 10/20/2021    9:55 AM  GAD 7 : Generalized Anxiety Score  Nervous, Anxious, on Edge 0 0 0 1  Control/stop worrying 0 0 0 1  Worry too much - different things 0 0 0 0  Trouble relaxing 0 0 0 0  Restless 0 0 0 1  Easily annoyed or irritable 0 0 0 1  Afraid - awful might happen 0 0 0 0  Total GAD 7 Score 0 0 0 4  Anxiety Difficulty Not difficult  at all Not difficult at all Not difficult at all Not difficult at all       07/24/2023    4:10 PM 03/28/2023    4:06 PM 11/01/2022    2:33 PM  Depression screen PHQ 2/9  Decreased Interest 0 0 0  Down, Depressed, Hopeless 0 0 0  PHQ - 2 Score 0 0 0  Altered sleeping 0 0 0  Tired, decreased energy 0 0 0  Change in appetite 0 0 0  Feeling bad or failure about yourself  0 0 0  Trouble concentrating  0 0 0  Moving slowly or fidgety/restless 0 0 0  Suicidal thoughts 0 0 0  PHQ-9 Score 0 0 0  Difficult doing work/chores Not difficult at all Not difficult at all Not difficult at all    BP Readings from Last 3 Encounters:  07/24/23 116/70  03/28/23 104/60  11/01/22 118/62    Physical Exam Constitutional:      Appearance: Normal appearance. She is well-developed.  HENT:     Right Ear: Ear canal and external ear normal. Tympanic membrane is not erythematous or retracted.     Left Ear: Ear canal and external ear normal. Tympanic membrane is not erythematous or retracted.     Nose:     Right Sinus: Maxillary sinus tenderness and frontal sinus tenderness present.     Left Sinus: Maxillary sinus tenderness and frontal sinus tenderness present.     Mouth/Throat:     Mouth: No oral lesions.     Pharynx: Uvula midline. Posterior oropharyngeal erythema present. No oropharyngeal exudate.  Cardiovascular:     Rate and Rhythm: Normal rate and regular rhythm.     Heart sounds: Normal heart sounds.  Pulmonary:     Breath sounds: Examination of the right-upper field reveals wheezing. Examination of the left-upper field reveals wheezing. Decreased breath sounds and wheezing present. No rhonchi or rales.  Lymphadenopathy:     Cervical: No cervical adenopathy.  Neurological:     Mental Status: She is alert and oriented to person, place, and time.     Wt Readings from Last 3 Encounters:  07/24/23 193 lb (87.5 kg)  03/28/23 196 lb (88.9 kg)  11/01/22 208 lb 3.2 oz (94.4 kg)    BP 116/70    Pulse 74   Temp 98 F (36.7 C) (Oral)   Ht 5\' 5"  (1.651 m)   Wt 193 lb (87.5 kg)   SpO2 97%   BMI 32.12 kg/m   Assessment and Plan:  Problem List Items Addressed This Visit       Unprioritized   Centrilobular emphysema (HCC) (Chronic)   Continue Anoro - use albuterol more liberally while treating infection      Other Visit Diagnoses       Acute non-recurrent maxillary sinusitis    -  Primary   continue Flonase and Singulair add sudafed bid for 5 days   Relevant Medications   amoxicillin-clavulanate (AUGMENTIN) 875-125 MG tablet       No follow-ups on file.    Reubin Milan, MD [facility] Primary Care [facility] Sports Medicine [place]

## 2023-07-30 ENCOUNTER — Encounter: Payer: Self-pay | Admitting: Internal Medicine

## 2023-07-30 ENCOUNTER — Ambulatory Visit: Payer: Medicare Other | Admitting: Internal Medicine

## 2023-07-30 VITALS — BP 118/70 | HR 84 | Ht 65.0 in | Wt 191.4 lb

## 2023-07-30 DIAGNOSIS — E785 Hyperlipidemia, unspecified: Secondary | ICD-10-CM

## 2023-07-30 DIAGNOSIS — Z720 Tobacco use: Secondary | ICD-10-CM | POA: Diagnosis not present

## 2023-07-30 DIAGNOSIS — E118 Type 2 diabetes mellitus with unspecified complications: Secondary | ICD-10-CM | POA: Diagnosis not present

## 2023-07-30 DIAGNOSIS — E1169 Type 2 diabetes mellitus with other specified complication: Secondary | ICD-10-CM | POA: Diagnosis not present

## 2023-07-30 DIAGNOSIS — Z7984 Long term (current) use of oral hypoglycemic drugs: Secondary | ICD-10-CM | POA: Diagnosis not present

## 2023-07-30 DIAGNOSIS — I700 Atherosclerosis of aorta: Secondary | ICD-10-CM

## 2023-07-30 MED ORDER — ALBUTEROL SULFATE HFA 108 (90 BASE) MCG/ACT IN AERS
2.0000 | INHALATION_SPRAY | RESPIRATORY_TRACT | 5 refills | Status: AC | PRN
Start: 2023-07-30 — End: ?

## 2023-07-30 MED ORDER — METFORMIN HCL ER 500 MG PO TB24: 500.0000 mg | ORAL_TABLET | Freq: Every day | ORAL | 1 refills | Status: DC

## 2023-07-30 MED ORDER — ATORVASTATIN CALCIUM 10 MG PO TABS: 10.0000 mg | ORAL_TABLET | Freq: Every day | ORAL | 1 refills | Status: DC

## 2023-07-30 NOTE — Assessment & Plan Note (Addendum)
 Blood sugars stable without hypoglycemic symptoms or events. Currently managed with diet and weight loss and MTF. Changes made last visit are stopping MTF due to concerns about allergic reaction but was able to restart. Left sided tongue and lip burning pain much improved - if it continues would recommend ENT evaluation Lab Results  Component Value Date   HGBA1C 6.2 (H) 03/28/2023

## 2023-07-30 NOTE — Progress Notes (Signed)
 Date:  07/30/2023   Name:  Diamond Jackson   DOB:  03-Nov-1955   MRN:  [id number]   Chief Complaint: Diabetes  Diabetes She presents for her follow-up diabetic visit. She has type 2 diabetes mellitus. Her disease course has been improving. Pertinent negatives for hypoglycemia include no headaches or tremors. Pertinent negatives for diabetes include no chest pain, no fatigue, no polydipsia and no polyuria. There are no hypoglycemic complications. She monitors blood glucose at home 1-2 x per day. Blood glucose monitoring compliance is excellent. There is no change (fasting 90-100) in her home blood glucose trend.    Review of Systems  Constitutional:  Negative for appetite change, fatigue, fever and unexpected weight change.  HENT:  Negative for tinnitus and trouble swallowing.   Eyes:  Negative for visual disturbance.  Respiratory:  Negative for cough, chest tightness and shortness of breath.   Cardiovascular:  Negative for chest pain, palpitations and leg swelling.  Gastrointestinal:  Negative for abdominal pain.  Endocrine: Negative for polydipsia and polyuria.  Genitourinary:  Negative for dysuria and hematuria.  Musculoskeletal:  Negative for arthralgias.  Neurological:  Negative for tremors, numbness and headaches.  Psychiatric/Behavioral:  Negative for dysphoric mood.      Lab Results  Component Value Date   NA 141 03/28/2023   K 4.2 03/28/2023   CO2 21 03/28/2023   GLUCOSE 93 03/28/2023   BUN 12 03/28/2023   CREATININE 1.03 (H) 03/28/2023   CALCIUM 9.6 03/28/2023   EGFR 60 03/28/2023   GFRNONAA 68 04/20/2020   Lab Results  Component Value Date   CHOL 164 08/31/2022   HDL 38 (L) 08/31/2022   LDLCALC 94 08/31/2022   TRIG 187 (H) 08/31/2022   CHOLHDL 4.3 08/31/2022   Lab Results  Component Value Date   TSH 1.030 08/31/2022   Lab Results  Component Value Date   HGBA1C 6.2 (H) 03/28/2023   Lab Results  Component Value Date   WBC 8.6 08/31/2022   HGB 16.0 (H)  08/31/2022   HCT 47.7 (H) 08/31/2022   MCV 87 08/31/2022   PLT 256 08/31/2022   Lab Results  Component Value Date   ALT 15 03/28/2023   AST 15 03/28/2023   ALKPHOS 89 03/28/2023   BILITOT 0.3 03/28/2023   No results found for: "25OHVITD2", "25OHVITD3", "VD25OH"   Patient Active Problem List   Diagnosis Date Noted   Hyperlipidemia associated with type 2 diabetes mellitus (HCC) 08/30/2022   Type II diabetes mellitus with complication (HCC) 10/20/2021   Coronary artery calcification seen on computed tomography 01/02/2021   Centrilobular emphysema (HCC) 06/01/2020   Aortic atherosclerosis (HCC) 06/01/2020   History of colonic polyps    Polyp of transverse colon    Benign neoplasm of cecum    Benign neoplasm of transverse colon    Benign neoplasm of rectosigmoid junction    Environmental and seasonal allergies 03/22/2015   Fibrocystic breast changes 03/22/2015   Hx of peptic ulcer 03/22/2015   Current tobacco use 03/22/2015    No Known Allergies  Past Surgical History:  Procedure Laterality Date   ANKLE SURGERY Right 1984   BREAST BIOPSY Left    benign two areas/clips   CHOLECYSTECTOMY  2010   CLEFT PALATE REPAIR     as an infant   COLONOSCOPY  2010   normal   COLONOSCOPY WITH PROPOFOL N/A 05/17/2018   Procedure: COLONOSCOPY WITH BIOPSIES;  Surgeon: Midge Minium, MD;  Location: [facility];  Service: Endoscopy;  Laterality: N/A;   COLONOSCOPY WITH PROPOFOL N/A 09/29/2019   Procedure: COLONOSCOPY WITH BIOPSY;  Surgeon: Midge Minium, MD;  Location: [facility];  Service: Endoscopy;  Laterality: N/A;  priority 3   COLONOSCOPY WITH PROPOFOL N/A 10/09/2022   Procedure: COLONOSCOPY WITH PROPOFOL;  Surgeon: Midge Minium, MD;  Location: [facility];  Service: Endoscopy;  Laterality: N/A;   POLYPECTOMY  05/17/2018   Procedure: POLYPECTOMY;  Surgeon: Midge Minium, MD;  Location: [facility];  Service: Endoscopy;;   POLYPECTOMY N/A 09/29/2019    Procedure: POLYPECTOMY;  Surgeon: Midge Minium, MD;  Location: [facility];  Service: Endoscopy;  Laterality: N/A;  Clips x 2 placed at site of Hepatic Flexure Polyp site removal   POLYPECTOMY  10/09/2022   Procedure: POLYPECTOMY;  Surgeon: Midge Minium, MD;  Location: [facility];  Service: Endoscopy;;    Social History   Tobacco Use   Smoking status: Every Day    Current packs/day: 1.50    Average packs/day: 1.5 packs/day for 48.0 years (72.0 ttl pk-yrs)    Types: Cigarettes   Smokeless tobacco: Never   Tobacco comments:    Less than 0.5 ppd  Vaping Use   Vaping status: Never Used  Substance Use Topics   Alcohol use: No    Alcohol/week: 0.0 standard drinks of alcohol   Drug use: No     Medication list has been reviewed and updated.  Current Meds  Medication Sig   amoxicillin-clavulanate (AUGMENTIN) 875-125 MG tablet Take 1 tablet by mouth 2 (two) times daily for 10 days.   ANORO ELLIPTA 62.5-25 MCG/ACT AEPB 1 puff daily.   blood glucose meter kit and supplies Dispense based on patient and insurance preference. Use up to four times daily as directed. (FOR ICD-10 E10.9, E11.9).   cetirizine (ZYRTEC) 10 MG tablet Take 10 mg by mouth daily as needed for allergies.   OPCON-A 0.027-0.315 % SOLN For eyes   pantoprazole (PROTONIX) 40 MG tablet Take 1 tablet (40 mg total) by mouth every morning.   [DISCONTINUED] albuterol (VENTOLIN HFA) 108 (90 Base) MCG/ACT inhaler INHALE 1 TO 2 PUFFS BY MOUTH EVERY 6 HOURS AS NEEDED FOR WHEEZING OR SHORTNESS OF BREATH   [DISCONTINUED] atorvastatin (LIPITOR) 10 MG tablet Take 1 tablet by mouth once daily   [DISCONTINUED] metFORMIN (GLUCOPHAGE-XR) 500 MG 24 hr tablet Take 1 tablet by mouth once daily with breakfast       07/30/2023    8:55 AM 07/24/2023    4:10 PM 03/28/2023    4:06 PM 08/30/2022   10:07 AM  GAD 7 : Generalized Anxiety Score  Nervous, Anxious, on Edge 0 0 0 0  Control/stop worrying 0 0 0 0  Worry too much -  different things 0 0 0 0  Trouble relaxing 0 0 0 0  Restless 0 0 0 0  Easily annoyed or irritable 0 0 0 0  Afraid - awful might happen 0 0 0 0  Total GAD 7 Score 0 0 0 0  Anxiety Difficulty Not difficult at all Not difficult at all Not difficult at all Not difficult at all       07/30/2023    8:55 AM 07/24/2023    4:10 PM 03/28/2023    4:06 PM  Depression screen PHQ 2/9  Decreased Interest 0 0 0  Down, Depressed, Hopeless 0 0 0  PHQ - 2 Score 0 0 0  Altered sleeping 0 0 0  Tired, decreased energy 0 0 0  Change  in appetite 0 0 0  Feeling bad or failure about yourself  0 0 0  Trouble concentrating 0 0 0  Moving slowly or fidgety/restless 0 0 0  Suicidal thoughts 0 0 0  PHQ-9 Score 0 0 0  Difficult doing work/chores Not difficult at all Not difficult at all Not difficult at all    BP Readings from Last 3 Encounters:  07/30/23 118/70  07/24/23 116/70  03/28/23 104/60    Physical Exam Vitals and nursing note reviewed.  Constitutional:      General: She is not in acute distress.    Appearance: She is well-developed.  HENT:     Head: Normocephalic and atraumatic.  Cardiovascular:     Rate and Rhythm: Normal rate and regular rhythm.  Pulmonary:     Effort: Pulmonary effort is normal. No respiratory distress.     Breath sounds: No wheezing or rhonchi.  Musculoskeletal:     Cervical back: Normal range of motion.     Right lower leg: No edema.     Left lower leg: No edema.  Lymphadenopathy:     Cervical: No cervical adenopathy.  Skin:    General: Skin is warm and dry.     Findings: No rash.  Neurological:     General: No focal deficit present.     Mental Status: She is alert and oriented to person, place, and time.  Psychiatric:        Mood and Affect: Mood normal.        Behavior: Behavior normal.     Wt Readings from Last 3 Encounters:  07/30/23 191 lb 6.4 oz (86.8 kg)  07/24/23 193 lb (87.5 kg)  03/28/23 196 lb (88.9 kg)    BP 118/70   Pulse 84   Ht 5'  5" (1.651 m)   Wt 191 lb 6.4 oz (86.8 kg)   SpO2 94%   BMI 31.85 kg/m   Assessment and Plan:  Problem List Items Addressed This Visit       Unprioritized   Current tobacco use (Chronic)   Relevant Medications   albuterol (VENTOLIN HFA) 108 (90 Base) MCG/ACT inhaler   Aortic atherosclerosis (HCC) (Chronic)   Relevant Medications   atorvastatin (LIPITOR) 10 MG tablet   Type II diabetes mellitus with complication (HCC) - Primary (Chronic)   Blood sugars stable without hypoglycemic symptoms or events. Currently managed with diet and weight loss and MTF. Changes made last visit are stopping MTF due to concerns about allergic reaction but was able to restart. Left sided tongue and lip burning pain much improved - if it continues would recommend ENT evaluation Lab Results  Component Value Date   HGBA1C 6.2 (H) 03/28/2023          Relevant Medications   metFORMIN (GLUCOPHAGE-XR) 500 MG 24 hr tablet   atorvastatin (LIPITOR) 10 MG tablet   Hyperlipidemia associated with type 2 diabetes mellitus (HCC)   Relevant Medications   metFORMIN (GLUCOPHAGE-XR) 500 MG 24 hr tablet   atorvastatin (LIPITOR) 10 MG tablet   Other Visit Diagnoses       Long term current use of oral hypoglycemic drug           No follow-ups on file.    Reubin Milan, MD [facility] [place]

## 2023-07-30 NOTE — Patient Instructions (Signed)
 Call [facility] to schedule your mammogram at [phone].

## 2023-08-11 DIAGNOSIS — E119 Type 2 diabetes mellitus without complications: Secondary | ICD-10-CM | POA: Diagnosis not present

## 2023-09-02 DIAGNOSIS — E119 Type 2 diabetes mellitus without complications: Secondary | ICD-10-CM | POA: Diagnosis not present

## 2023-09-02 DIAGNOSIS — S52572A Other intraarticular fracture of lower end of left radius, initial encounter for closed fracture: Secondary | ICD-10-CM | POA: Diagnosis not present

## 2023-09-02 DIAGNOSIS — M19032 Primary osteoarthritis, left wrist: Secondary | ICD-10-CM | POA: Diagnosis not present

## 2023-09-02 DIAGNOSIS — S52502A Unspecified fracture of the lower end of left radius, initial encounter for closed fracture: Secondary | ICD-10-CM | POA: Diagnosis not present

## 2023-09-02 DIAGNOSIS — W1830XA Fall on same level, unspecified, initial encounter: Secondary | ICD-10-CM | POA: Diagnosis not present

## 2023-09-02 DIAGNOSIS — F17210 Nicotine dependence, cigarettes, uncomplicated: Secondary | ICD-10-CM | POA: Diagnosis not present

## 2023-09-02 DIAGNOSIS — M79632 Pain in left forearm: Secondary | ICD-10-CM | POA: Diagnosis not present

## 2023-09-02 DIAGNOSIS — J449 Chronic obstructive pulmonary disease, unspecified: Secondary | ICD-10-CM | POA: Diagnosis not present

## 2023-09-03 ENCOUNTER — Encounter: Payer: Self-pay | Admitting: Internal Medicine

## 2023-09-03 ENCOUNTER — Ambulatory Visit (INDEPENDENT_AMBULATORY_CARE_PROVIDER_SITE_OTHER): Payer: 59 | Admitting: Internal Medicine

## 2023-09-03 VITALS — BP 120/70 | HR 70 | Ht 65.0 in | Wt 195.5 lb

## 2023-09-03 DIAGNOSIS — E118 Type 2 diabetes mellitus with unspecified complications: Secondary | ICD-10-CM

## 2023-09-03 DIAGNOSIS — Z7984 Long term (current) use of oral hypoglycemic drugs: Secondary | ICD-10-CM | POA: Diagnosis not present

## 2023-09-03 DIAGNOSIS — E785 Hyperlipidemia, unspecified: Secondary | ICD-10-CM | POA: Diagnosis not present

## 2023-09-03 DIAGNOSIS — Z0000 Encounter for general adult medical examination without abnormal findings: Secondary | ICD-10-CM

## 2023-09-03 DIAGNOSIS — E1169 Type 2 diabetes mellitus with other specified complication: Secondary | ICD-10-CM

## 2023-09-03 DIAGNOSIS — J432 Centrilobular emphysema: Secondary | ICD-10-CM | POA: Diagnosis not present

## 2023-09-03 DIAGNOSIS — S62102A Fracture of unspecified carpal bone, left wrist, initial encounter for closed fracture: Secondary | ICD-10-CM | POA: Insufficient documentation

## 2023-09-03 DIAGNOSIS — I700 Atherosclerosis of aorta: Secondary | ICD-10-CM | POA: Diagnosis not present

## 2023-09-03 DIAGNOSIS — S52562A Barton's fracture of left radius, initial encounter for closed fracture: Secondary | ICD-10-CM | POA: Diagnosis not present

## 2023-09-03 DIAGNOSIS — Z8711 Personal history of peptic ulcer disease: Secondary | ICD-10-CM

## 2023-09-03 DIAGNOSIS — Z1231 Encounter for screening mammogram for malignant neoplasm of breast: Secondary | ICD-10-CM

## 2023-09-03 NOTE — Assessment & Plan Note (Signed)
 Stable symptoms with intermittent shortness of breath and wheezing managed with Anoro and albuterol.

## 2023-09-03 NOTE — Assessment & Plan Note (Signed)
 Reflux symptoms are controlled on pantoprazole. Patient denies red flag symptoms - no melena, weight loss, dysphagia.

## 2023-09-03 NOTE — Assessment & Plan Note (Signed)
 Will recheck lipids and advise.

## 2023-09-03 NOTE — Assessment & Plan Note (Signed)
 Waiting for Ortho appointment. Continue pain medications, elevation

## 2023-09-03 NOTE — Assessment & Plan Note (Signed)
 Blood sugars have been stable.  No recent hypoglycemic events requiring assistance. Currently medications are metformin. Lab Results  Component Value Date   HGBA1C 6.2 (H) 03/28/2023   Last visit no changes were made.

## 2023-09-03 NOTE — Assessment & Plan Note (Signed)
 LDL is  Lab Results  Component Value Date   LDLCALC 94 08/31/2022   Current regimen is atorvastatin.  No medication side effects noted. Goal LDL is <70.

## 2023-09-03 NOTE — Patient Instructions (Signed)
 Call [facility] to schedule your mammogram at [phone].

## 2023-09-03 NOTE — Progress Notes (Signed)
 Date:  09/03/2023   Name:  Diamond Jackson   DOB:  [birth year]   MRN:  [id number]   Chief Complaint: Annual Exam Diamond Jackson is a 68 y.o. female who presents today for her Complete Annual Exam. She feels well. She reports exercising goes to the gym 4 times a week. She reports she is sleeping fairly well. Breast complaints none. She fell yesterday and fractured her wrist in [employer].  She is waiting for Ortho appointment.  Health Maintenance  Topic Date Due   DTaP/Tdap/Td vaccine (2 - Td or Tdap) 04/18/2023   Mammogram  05/11/2023   Yearly kidney health urinalysis for diabetes  08/31/2023   Medicare Annual Wellness Visit  11/01/2023   COVID-19 Vaccine (4 - 2024-25 season) 09/19/2023*   Eye exam for diabetics  11/03/2023*   Hemoglobin A1C  09/26/2023   Screening for Lung Cancer  01/22/2024   Yearly kidney function blood test for diabetes  03/27/2024   Complete foot exam   09/02/2024   Colon Cancer Screening  10/09/2027   Pneumonia Vaccine  Completed   Flu Shot  Completed   DEXA scan (bone density measurement)  Completed   Zoster (Shingles) Vaccine  Completed   Hepatitis C Screening  Addressed   HPV Vaccine  Aged Out  *Topic was postponed. The date shown is not the original due date.    Hyperlipidemia This is a chronic problem. The problem is controlled. Pertinent negatives include no chest pain, myalgias or shortness of breath. Current antihyperlipidemic treatment includes statins. The current treatment provides significant improvement of lipids.  Diabetes She presents for her follow-up diabetic visit. She has type 2 diabetes mellitus. Her disease course has been stable. Pertinent negatives for hypoglycemia include no dizziness, headaches or nervousness/anxiousness. Pertinent negatives for diabetes include no chest pain, no fatigue and no weakness. Current diabetic treatment includes oral agent (monotherapy). She is compliant with treatment all of the time.    Review of Systems   Constitutional:  Negative for fatigue and unexpected weight change.  HENT:  Negative for trouble swallowing.   Eyes:  Negative for visual disturbance.  Respiratory:  Negative for cough, chest tightness, shortness of breath and wheezing.   Cardiovascular:  Negative for chest pain, palpitations and leg swelling.  Gastrointestinal:  Negative for abdominal pain, constipation and diarrhea.  Musculoskeletal:  Positive for arthralgias. Negative for myalgias.  Neurological:  Negative for dizziness, weakness, light-headedness and headaches.  Psychiatric/Behavioral:  Negative for dysphoric mood and sleep disturbance. The patient is not nervous/anxious.      Lab Results  Component Value Date   NA 141 03/28/2023   K 4.2 03/28/2023   CO2 21 03/28/2023   GLUCOSE 93 03/28/2023   BUN 12 03/28/2023   CREATININE 1.03 (H) 03/28/2023   CALCIUM 9.6 03/28/2023   EGFR 60 03/28/2023   GFRNONAA 68 04/20/2020   Lab Results  Component Value Date   CHOL 164 08/31/2022   HDL 38 (L) 08/31/2022   LDLCALC 94 08/31/2022   TRIG 187 (H) 08/31/2022   CHOLHDL 4.3 08/31/2022   Lab Results  Component Value Date   TSH 1.030 08/31/2022   Lab Results  Component Value Date   HGBA1C 6.2 (H) 03/28/2023   Lab Results  Component Value Date   WBC 8.6 08/31/2022   HGB 16.0 (H) 08/31/2022   HCT 47.7 (H) 08/31/2022   MCV 87 08/31/2022   PLT 256 08/31/2022   Lab Results  Component Value Date   ALT 15  03/28/2023   AST 15 03/28/2023   ALKPHOS 89 03/28/2023   BILITOT 0.3 03/28/2023   No results found for: "25OHVITD2", "25OHVITD3", "VD25OH"   Patient Active Problem List   Diagnosis Date Noted   Left wrist fracture, closed, initial encounter 09/03/2023   Hyperlipidemia associated with type 2 diabetes mellitus (HCC) 08/30/2022   Type II diabetes mellitus with complication (HCC) 10/20/2021   Coronary artery calcification seen on computed tomography 01/02/2021   Centrilobular emphysema (HCC) 06/01/2020   Aortic  atherosclerosis (HCC) 06/01/2020   History of colonic polyps    Polyp of transverse colon    Benign neoplasm of cecum    Benign neoplasm of transverse colon    Benign neoplasm of rectosigmoid junction    Environmental and seasonal allergies 03/22/2015   Fibrocystic breast changes 03/22/2015   Hx of peptic ulcer 03/22/2015   Current tobacco use 03/22/2015    No Known Allergies  Past Surgical History:  Procedure Laterality Date   ANKLE SURGERY Right 1984   BREAST BIOPSY Left    benign two areas/clips   CHOLECYSTECTOMY  2010   CLEFT PALATE REPAIR     as an infant   COLONOSCOPY  2010   normal   COLONOSCOPY WITH PROPOFOL N/A 05/17/2018   Procedure: COLONOSCOPY WITH BIOPSIES;  Surgeon: Midge Minium, MD;  Location: [facility];  Service: Endoscopy;  Laterality: N/A;   COLONOSCOPY WITH PROPOFOL N/A 09/29/2019   Procedure: COLONOSCOPY WITH BIOPSY;  Surgeon: Midge Minium, MD;  Location: [facility];  Service: Endoscopy;  Laterality: N/A;  priority 3   COLONOSCOPY WITH PROPOFOL N/A 10/09/2022   Procedure: COLONOSCOPY WITH PROPOFOL;  Surgeon: Midge Minium, MD;  Location: [facility];  Service: Endoscopy;  Laterality: N/A;   POLYPECTOMY  05/17/2018   Procedure: POLYPECTOMY;  Surgeon: Midge Minium, MD;  Location: [facility];  Service: Endoscopy;;   POLYPECTOMY N/A 09/29/2019   Procedure: POLYPECTOMY;  Surgeon: Midge Minium, MD;  Location: [facility];  Service: Endoscopy;  Laterality: N/A;  Clips x 2 placed at site of Hepatic Flexure Polyp site removal   POLYPECTOMY  10/09/2022   Procedure: POLYPECTOMY;  Surgeon: Midge Minium, MD;  Location: [facility];  Service: Endoscopy;;    Social History   Tobacco Use   Smoking status: Every Day    Current packs/day: 1.50    Average packs/day: 1.5 packs/day for 48.0 years (72.0 ttl pk-yrs)    Types: Cigarettes   Smokeless tobacco: Never   Tobacco comments:    Less than 0.5 ppd  Vaping Use   Vaping  status: Never Used  Substance Use Topics   Alcohol use: No    Alcohol/week: 0.0 standard drinks of alcohol   Drug use: No     Medication list has been reviewed and updated.  Current Meds  Medication Sig   albuterol (VENTOLIN HFA) 108 (90 Base) MCG/ACT inhaler Inhale 2 puffs into the lungs every 4 (four) hours as needed for wheezing or shortness of breath.   ANORO ELLIPTA 62.5-25 MCG/ACT AEPB 1 puff daily.   atorvastatin (LIPITOR) 10 MG tablet Take 1 tablet (10 mg total) by mouth daily.   blood glucose meter kit and supplies Dispense based on patient and insurance preference. Use up to four times daily as directed. (FOR ICD-10 E10.9, E11.9).   cetirizine (ZYRTEC) 10 MG tablet Take 10 mg by mouth daily as needed for allergies.   metFORMIN (GLUCOPHAGE-XR) 500 MG 24 hr tablet Take 1 tablet (500 mg total) by mouth  daily with breakfast.   OPCON-A 0.027-0.315 % SOLN For eyes   pantoprazole (PROTONIX) 40 MG tablet Take 1 tablet (40 mg total) by mouth every morning.       09/03/2023    8:12 AM 07/30/2023    8:55 AM 07/24/2023    4:10 PM 03/28/2023    4:06 PM  GAD 7 : Generalized Anxiety Score  Nervous, Anxious, on Edge 0 0 0 0  Control/stop worrying 0 0 0 0  Worry too much - different things 0 0 0 0  Trouble relaxing 0 0 0 0  Restless 0 0 0 0  Easily annoyed or irritable 0 0 0 0  Afraid - awful might happen 0 0 0 0  Total GAD 7 Score 0 0 0 0  Anxiety Difficulty Not difficult at all Not difficult at all Not difficult at all Not difficult at all       09/03/2023    8:12 AM 07/30/2023    8:55 AM 07/24/2023    4:10 PM  Depression screen PHQ 2/9  Decreased Interest 0 0 0  Down, Depressed, Hopeless 0 0 0  PHQ - 2 Score 0 0 0  Altered sleeping 0 0 0  Tired, decreased energy 0 0 0  Change in appetite 0 0 0  Feeling bad or failure about yourself  0 0 0  Trouble concentrating 0 0 0  Moving slowly or fidgety/restless 0 0 0  Suicidal thoughts 0 0 0  PHQ-9 Score 0 0 0  Difficult doing  work/chores Not difficult at all Not difficult at all Not difficult at all    BP Readings from Last 3 Encounters:  09/03/23 120/70  07/30/23 118/70  07/24/23 116/70    Physical Exam Vitals and nursing note reviewed.  Constitutional:      General: She is not in acute distress.    Appearance: She is well-developed.  HENT:     Head: Normocephalic and atraumatic.     Right Ear: Tympanic membrane and ear canal normal.     Left Ear: Tympanic membrane and ear canal normal.     Nose:     Right Sinus: No maxillary sinus tenderness.     Left Sinus: No maxillary sinus tenderness.  Eyes:     General: No scleral icterus.       Right eye: No discharge.        Left eye: No discharge.     Conjunctiva/sclera: Conjunctivae normal.  Neck:     Thyroid: No thyromegaly.     Vascular: No carotid bruit.  Cardiovascular:     Rate and Rhythm: Normal rate and regular rhythm.     Pulses: Normal pulses.     Heart sounds: Normal heart sounds.  Pulmonary:     Effort: Pulmonary effort is normal. No respiratory distress.     Breath sounds: No wheezing.  Abdominal:     General: Bowel sounds are normal.     Palpations: Abdomen is soft.     Tenderness: There is no abdominal tenderness.  Musculoskeletal:     Cervical back: Normal range of motion. No erythema.     Right lower leg: No edema.     Left lower leg: No edema.  Lymphadenopathy:     Cervical: No cervical adenopathy.  Skin:    General: Skin is warm and dry.     Findings: No rash.  Neurological:     Mental Status: She is alert and oriented to person, place, and time.  Cranial Nerves: No cranial nerve deficit.     Sensory: No sensory deficit.     Deep Tendon Reflexes: Reflexes are normal and symmetric.  Psychiatric:        Attention and Perception: Attention normal.        Mood and Affect: Mood normal.    Diabetic Foot Exam - Simple   Simple Foot Form Diabetic Foot exam was performed with the following findings: Yes 09/03/2023  8:23 AM   Visual Inspection No deformities, no ulcerations, no other skin breakdown bilaterally: Yes Sensation Testing Intact to touch and monofilament testing bilaterally: Yes Pulse Check Posterior Tibialis and Dorsalis pulse intact bilaterally: Yes Comments      Wt Readings from Last 3 Encounters:  09/03/23 195 lb 8 oz (88.7 kg)  07/30/23 191 lb 6.4 oz (86.8 kg)  07/24/23 193 lb (87.5 kg)    BP 120/70   Pulse 70   Ht 5\' 5"  (1.651 m)   Wt 195 lb 8 oz (88.7 kg)   SpO2 96%   BMI 32.53 kg/m   Assessment and Plan:  Problem List Items Addressed This Visit       Unprioritized   Hx of peptic ulcer (Chronic)   Reflux symptoms are controlled on pantoprazole. Patient denies red flag symptoms - no melena, weight loss, dysphagia.       Relevant Orders   CBC with Differential/Platelet   Centrilobular emphysema (HCC) (Chronic)   Stable symptoms with intermittent shortness of breath and wheezing managed with Anoro and albuterol.      Relevant Orders   CBC with Differential/Platelet   Aortic atherosclerosis (HCC) (Chronic)   LDL is  Lab Results  Component Value Date   LDLCALC 94 08/31/2022   Current regimen is atorvastatin.  No medication side effects noted. Goal LDL is <70.       Relevant Orders   Lipid panel   Type II diabetes mellitus with complication (HCC) (Chronic)   Blood sugars have been stable.  No recent hypoglycemic events requiring assistance. Currently medications are metformin. Lab Results  Component Value Date   HGBA1C 6.2 (H) 03/28/2023   Last visit no changes were made.       Relevant Orders   Comprehensive metabolic panel   Hemoglobin A1c   Microalbumin / creatinine urine ratio   TSH   Hyperlipidemia associated with type 2 diabetes mellitus (HCC)   Will recheck lipids and advise.      Relevant Orders   Lipid panel   Left wrist fracture, closed, initial encounter   Waiting for Ortho appointment. Continue pain medications, elevation       Other Visit Diagnoses       Annual physical exam    -  Primary   continue work on diet and exercise as able. up to date except for mammogram     Encounter for screening mammogram for breast cancer       Relevant Orders   MM 3D SCREENING MAMMOGRAM BILATERAL BREAST     Long term current use of oral hypoglycemic drug           Return in about 4 months (around 01/03/2024) for DM.    Reubin Milan, MD [facility] [place]

## 2023-09-04 ENCOUNTER — Encounter: Payer: Self-pay | Admitting: Internal Medicine

## 2023-09-04 LAB — CBC WITH DIFFERENTIAL/PLATELET
Basophils Absolute: 0.1 10*3/uL (ref 0.0–0.2)
Basos: 1 %
EOS (ABSOLUTE): 1 10*3/uL — ABNORMAL HIGH (ref 0.0–0.4)
Eos: 11 %
Hematocrit: 43.2 % (ref 34.0–46.6)
Hemoglobin: 14.1 g/dL (ref 11.1–15.9)
Immature Grans (Abs): 0 10*3/uL (ref 0.0–0.1)
Immature Granulocytes: 1 %
Lymphocytes Absolute: 2.1 10*3/uL (ref 0.7–3.1)
Lymphs: 25 %
MCH: 28.6 pg (ref 26.6–33.0)
MCHC: 32.6 g/dL (ref 31.5–35.7)
MCV: 88 fL (ref 79–97)
Monocytes Absolute: 0.7 10*3/uL (ref 0.1–0.9)
Monocytes: 8 %
Neutrophils Absolute: 4.8 10*3/uL (ref 1.4–7.0)
Neutrophils: 54 %
Platelets: 198 10*3/uL (ref 150–450)
RBC: 4.93 x10E6/uL (ref 3.77–5.28)
RDW: 13.1 % (ref 11.7–15.4)
WBC: 8.6 10*3/uL (ref 3.4–10.8)

## 2023-09-04 LAB — COMPREHENSIVE METABOLIC PANEL
ALT: 14 IU/L (ref 0–32)
AST: 12 IU/L (ref 0–40)
Albumin: 4.1 g/dL (ref 3.9–4.9)
Alkaline Phosphatase: 91 IU/L (ref 44–121)
BUN/Creatinine Ratio: 22 (ref 12–28)
BUN: 21 mg/dL (ref 8–27)
Bilirubin Total: 0.3 mg/dL (ref 0.0–1.2)
CO2: 20 mmol/L (ref 20–29)
Calcium: 9.3 mg/dL (ref 8.7–10.3)
Chloride: 106 mmol/L (ref 96–106)
Creatinine, Ser: 0.97 mg/dL (ref 0.57–1.00)
Globulin, Total: 2.1 g/dL (ref 1.5–4.5)
Glucose: 98 mg/dL (ref 70–99)
Potassium: 4.2 mmol/L (ref 3.5–5.2)
Sodium: 140 mmol/L (ref 134–144)
Total Protein: 6.2 g/dL (ref 6.0–8.5)
eGFR: 64 mL/min/{1.73_m2} (ref >59)

## 2023-09-04 LAB — LIPID PANEL
Chol/HDL Ratio: 3 ratio (ref 0.0–4.4)
Cholesterol, Total: 125 mg/dL (ref 100–199)
HDL: 41 mg/dL (ref >39)
LDL Chol Calc (NIH): 66 mg/dL (ref 0–99)
Triglycerides: 96 mg/dL (ref 0–149)
VLDL Cholesterol Cal: 18 mg/dL (ref 5–40)

## 2023-09-04 LAB — MICROALBUMIN / CREATININE URINE RATIO
Creatinine, Urine: 28.6 mg/dL
Microalb/Creat Ratio: <10 mg/g{creat} (ref 0–29)
Microalbumin, Urine: <3 ug/mL

## 2023-09-04 LAB — HEMOGLOBIN A1C
Est. average glucose Bld gHb Est-mCnc: 140 mg/dL
Hgb A1c MFr Bld: 6.5 % — ABNORMAL HIGH (ref 4.8–5.6)

## 2023-09-04 LAB — TSH: TSH: 0.685 u[IU]/mL (ref 0.450–4.500)

## 2023-09-06 DIAGNOSIS — I2510 Atherosclerotic heart disease of native coronary artery without angina pectoris: Secondary | ICD-10-CM | POA: Diagnosis not present

## 2023-09-06 DIAGNOSIS — G8918 Other acute postprocedural pain: Secondary | ICD-10-CM | POA: Diagnosis not present

## 2023-09-06 DIAGNOSIS — E66811 Obesity, class 1: Secondary | ICD-10-CM | POA: Diagnosis not present

## 2023-09-06 DIAGNOSIS — E119 Type 2 diabetes mellitus without complications: Secondary | ICD-10-CM | POA: Diagnosis not present

## 2023-09-06 DIAGNOSIS — Z6832 Body mass index (BMI) 32.0-32.9, adult: Secondary | ICD-10-CM | POA: Diagnosis not present

## 2023-09-06 DIAGNOSIS — K219 Gastro-esophageal reflux disease without esophagitis: Secondary | ICD-10-CM | POA: Diagnosis not present

## 2023-09-06 DIAGNOSIS — I10 Essential (primary) hypertension: Secondary | ICD-10-CM | POA: Diagnosis not present

## 2023-09-06 DIAGNOSIS — E785 Hyperlipidemia, unspecified: Secondary | ICD-10-CM | POA: Diagnosis not present

## 2023-09-06 DIAGNOSIS — W010XXA Fall on same level from slipping, tripping and stumbling without subsequent striking against object, initial encounter: Secondary | ICD-10-CM | POA: Diagnosis not present

## 2023-09-06 DIAGNOSIS — S52502A Unspecified fracture of the lower end of left radius, initial encounter for closed fracture: Secondary | ICD-10-CM | POA: Diagnosis not present

## 2023-09-06 DIAGNOSIS — S52572A Other intraarticular fracture of lower end of left radius, initial encounter for closed fracture: Secondary | ICD-10-CM | POA: Diagnosis not present

## 2023-09-11 DIAGNOSIS — E119 Type 2 diabetes mellitus without complications: Secondary | ICD-10-CM | POA: Diagnosis not present

## 2023-09-13 DIAGNOSIS — S52572S Other intraarticular fracture of lower end of left radius, sequela: Secondary | ICD-10-CM | POA: Diagnosis not present

## 2023-09-13 DIAGNOSIS — X58XXXS Exposure to other specified factors, sequela: Secondary | ICD-10-CM | POA: Diagnosis not present

## 2023-09-13 DIAGNOSIS — M25631 Stiffness of right wrist, not elsewhere classified: Secondary | ICD-10-CM | POA: Diagnosis not present

## 2023-09-24 DIAGNOSIS — M25631 Stiffness of right wrist, not elsewhere classified: Secondary | ICD-10-CM | POA: Diagnosis not present

## 2023-09-24 DIAGNOSIS — S52572S Other intraarticular fracture of lower end of left radius, sequela: Secondary | ICD-10-CM | POA: Diagnosis not present

## 2023-09-24 DIAGNOSIS — X58XXXS Exposure to other specified factors, sequela: Secondary | ICD-10-CM | POA: Diagnosis not present

## 2023-09-26 ENCOUNTER — Ambulatory Visit
Admission: RE | Admit: 2023-09-26 | Discharge: 2023-09-26 | Disposition: A | Discharge status: Home / Self Care | Source: Ambulatory Visit | Attending: Internal Medicine | Admitting: Internal Medicine

## 2023-09-26 DIAGNOSIS — Z1231 Encounter for screening mammogram for malignant neoplasm of breast: Secondary | ICD-10-CM | POA: Insufficient documentation

## 2023-10-03 DIAGNOSIS — X58XXXS Exposure to other specified factors, sequela: Secondary | ICD-10-CM | POA: Diagnosis not present

## 2023-10-03 DIAGNOSIS — M25631 Stiffness of right wrist, not elsewhere classified: Secondary | ICD-10-CM | POA: Diagnosis not present

## 2023-10-03 DIAGNOSIS — S52572S Other intraarticular fracture of lower end of left radius, sequela: Secondary | ICD-10-CM | POA: Diagnosis not present

## 2023-10-11 DIAGNOSIS — Z9889 Other specified postprocedural states: Secondary | ICD-10-CM | POA: Diagnosis not present

## 2023-10-11 DIAGNOSIS — E119 Type 2 diabetes mellitus without complications: Secondary | ICD-10-CM | POA: Diagnosis not present

## 2023-10-11 DIAGNOSIS — S52572D Other intraarticular fracture of lower end of left radius, subsequent encounter for closed fracture with routine healing: Secondary | ICD-10-CM | POA: Diagnosis not present

## 2023-10-11 DIAGNOSIS — M25631 Stiffness of right wrist, not elsewhere classified: Secondary | ICD-10-CM | POA: Diagnosis not present

## 2023-10-11 DIAGNOSIS — Z8781 Personal history of (healed) traumatic fracture: Secondary | ICD-10-CM | POA: Diagnosis not present

## 2023-10-11 DIAGNOSIS — X58XXXS Exposure to other specified factors, sequela: Secondary | ICD-10-CM | POA: Diagnosis not present

## 2023-10-11 DIAGNOSIS — S52572S Other intraarticular fracture of lower end of left radius, sequela: Secondary | ICD-10-CM | POA: Diagnosis not present

## 2023-10-18 DIAGNOSIS — X58XXXS Exposure to other specified factors, sequela: Secondary | ICD-10-CM | POA: Diagnosis not present

## 2023-10-18 DIAGNOSIS — M25631 Stiffness of right wrist, not elsewhere classified: Secondary | ICD-10-CM | POA: Diagnosis not present

## 2023-10-18 DIAGNOSIS — S52572S Other intraarticular fracture of lower end of left radius, sequela: Secondary | ICD-10-CM | POA: Diagnosis not present

## 2023-10-25 DIAGNOSIS — J301 Allergic rhinitis due to pollen: Secondary | ICD-10-CM | POA: Diagnosis not present

## 2023-10-25 DIAGNOSIS — K219 Gastro-esophageal reflux disease without esophagitis: Secondary | ICD-10-CM | POA: Diagnosis not present

## 2023-10-25 DIAGNOSIS — F17218 Nicotine dependence, cigarettes, with other nicotine-induced disorders: Secondary | ICD-10-CM | POA: Diagnosis not present

## 2023-10-25 DIAGNOSIS — R0602 Shortness of breath: Secondary | ICD-10-CM | POA: Diagnosis not present

## 2023-10-31 ENCOUNTER — Other Ambulatory Visit: Payer: Self-pay | Admitting: Internal Medicine

## 2023-10-31 DIAGNOSIS — S62102A Fracture of unspecified carpal bone, left wrist, initial encounter for closed fracture: Secondary | ICD-10-CM

## 2023-10-31 NOTE — Progress Notes (Unsigned)
 Date:  10/31/2023   Name:  Diamond Jackson   DOB:  November 28, 1955   MRN:  [id number]   Chief Complaint: No chief complaint on file.  HPI  Review of Systems   Lab Results  Component Value Date   NA 140 09/03/2023   K 4.2 09/03/2023   CO2 20 09/03/2023   GLUCOSE 98 09/03/2023   BUN 21 09/03/2023   CREATININE 0.97 09/03/2023   CALCIUM  9.3 09/03/2023   EGFR 64 09/03/2023   GFRNONAA 68 04/20/2020   Lab Results  Component Value Date   CHOL 125 09/03/2023   HDL 41 09/03/2023   LDLCALC 66 09/03/2023   TRIG 96 09/03/2023   CHOLHDL 3.0 09/03/2023   Lab Results  Component Value Date   TSH 0.685 09/03/2023   Lab Results  Component Value Date   HGBA1C 6.5 (H) 09/03/2023   Lab Results  Component Value Date   WBC 8.6 09/03/2023   HGB 14.1 09/03/2023   HCT 43.2 09/03/2023   MCV 88 09/03/2023   PLT 198 09/03/2023   Lab Results  Component Value Date   ALT 14 09/03/2023   AST 12 09/03/2023   ALKPHOS 91 09/03/2023   BILITOT 0.3 09/03/2023   No results found for: "25OHVITD2", "25OHVITD3", "VD25OH"   Patient Active Problem List   Diagnosis Date Noted   Left wrist fracture, closed, initial encounter 09/03/2023   Hyperlipidemia associated with type 2 diabetes mellitus (HCC) 08/30/2022   Type II diabetes mellitus with complication (HCC) 10/20/2021   Coronary artery calcification seen on computed tomography 01/02/2021   Centrilobular emphysema (HCC) 06/01/2020   Aortic atherosclerosis (HCC) 06/01/2020   History of colonic polyps    Polyp of transverse colon    Benign neoplasm of cecum    Benign neoplasm of transverse colon    Benign neoplasm of rectosigmoid junction    Environmental and seasonal allergies 03/22/2015   Fibrocystic breast changes 03/22/2015   Hx of peptic ulcer 03/22/2015   Current tobacco use 03/22/2015    No Known Allergies  Past Surgical History:  Procedure Laterality Date   ANKLE SURGERY Right 1984   BREAST BIOPSY Left    benign two areas/clips    CHOLECYSTECTOMY  2010   CLEFT PALATE REPAIR     as an infant   COLONOSCOPY  2010   normal   COLONOSCOPY WITH PROPOFOL  N/A 05/17/2018   Procedure: COLONOSCOPY WITH BIOPSIES;  Surgeon: Marnee Sink, MD;  Location: [facility];  Service: Endoscopy;  Laterality: N/A;   COLONOSCOPY WITH PROPOFOL  N/A 09/29/2019   Procedure: COLONOSCOPY WITH BIOPSY;  Surgeon: Marnee Sink, MD;  Location: [facility];  Service: Endoscopy;  Laterality: N/A;  priority 3   COLONOSCOPY WITH PROPOFOL  N/A 10/09/2022   Procedure: COLONOSCOPY WITH PROPOFOL ;  Surgeon: Marnee Sink, MD;  Location: [facility];  Service: Endoscopy;  Laterality: N/A;   POLYPECTOMY  05/17/2018   Procedure: POLYPECTOMY;  Surgeon: Marnee Sink, MD;  Location: [facility];  Service: Endoscopy;;   POLYPECTOMY N/A 09/29/2019   Procedure: POLYPECTOMY;  Surgeon: Marnee Sink, MD;  Location: [facility];  Service: Endoscopy;  Laterality: N/A;  Clips x 2 placed at site of Hepatic Flexure Polyp site removal   POLYPECTOMY  10/09/2022   Procedure: POLYPECTOMY;  Surgeon: Marnee Sink, MD;  Location: [facility];  Service: Endoscopy;;    Social History   Tobacco Use   Smoking status: Every Day    Current packs/day: 1.50    Average  packs/day: 1.5 packs/day for 48.0 years (72.0 ttl pk-yrs)    Types: Cigarettes   Smokeless tobacco: Never   Tobacco comments:    Less than 0.5 ppd  Vaping Use   Vaping status: Never Used  Substance Use Topics   Alcohol use: No    Alcohol/week: 0.0 standard drinks of alcohol   Drug use: No     Medication list has been reviewed and updated.  No outpatient medications have been marked as taking for the 10/31/23 encounter (Orders Only) with Sheron Dixons, MD.       09/03/2023    8:12 AM 07/30/2023    8:55 AM 07/24/2023    4:10 PM 03/28/2023    4:06 PM  GAD 7 : Generalized Anxiety Score  Nervous, Anxious, on Edge 0 0 0 0  Control/stop worrying 0 0 0 0  Worry too much  - different things 0 0 0 0  Trouble relaxing 0 0 0 0  Restless 0 0 0 0  Easily annoyed or irritable 0 0 0 0  Afraid - awful might happen 0 0 0 0  Total GAD 7 Score 0 0 0 0  Anxiety Difficulty Not difficult at all Not difficult at all Not difficult at all Not difficult at all       09/03/2023    8:12 AM 07/30/2023    8:55 AM 07/24/2023    4:10 PM  Depression screen PHQ 2/9  Decreased Interest 0 0 0  Down, Depressed, Hopeless 0 0 0  PHQ - 2 Score 0 0 0  Altered sleeping 0 0 0  Tired, decreased energy 0 0 0  Change in appetite 0 0 0  Feeling bad or failure about yourself  0 0 0  Trouble concentrating 0 0 0  Moving slowly or fidgety/restless 0 0 0  Suicidal thoughts 0 0 0  PHQ-9 Score 0 0 0  Difficult doing work/chores Not difficult at all Not difficult at all Not difficult at all    BP Readings from Last 3 Encounters:  09/03/23 120/70  07/30/23 118/70  07/24/23 116/70    Physical Exam  Wt Readings from Last 3 Encounters:  09/03/23 195 lb 8 oz (88.7 kg)  07/30/23 191 lb 6.4 oz (86.8 kg)  07/24/23 193 lb (87.5 kg)    There were no vitals taken for this visit.  Assessment and Plan:  Problem List Items Addressed This Visit   None   No follow-ups on file.    Sheron Dixons, MD [facility] [place]

## 2023-11-02 ENCOUNTER — Telehealth: Payer: Self-pay | Admitting: Internal Medicine

## 2023-11-02 NOTE — Telephone Encounter (Signed)
 Copied from CRM [id number]. Topic: General - Other >> Nov 02, 2023  1:43 PM Virgia Griffins wrote: Dee Farber from [facility] calling to acquire a Bone density for pt as it is recommended for pt of her age with a previous fracture. Best call back number [phone] ext [phone]

## 2023-11-07 ENCOUNTER — Ambulatory Visit: Payer: 59

## 2023-11-07 VITALS — Ht 65.0 in | Wt 190.0 lb

## 2023-11-07 DIAGNOSIS — Z0000 Encounter for general adult medical examination without abnormal findings: Secondary | ICD-10-CM

## 2023-11-07 DIAGNOSIS — Z7984 Long term (current) use of oral hypoglycemic drugs: Secondary | ICD-10-CM

## 2023-11-07 DIAGNOSIS — E119 Type 2 diabetes mellitus without complications: Secondary | ICD-10-CM | POA: Diagnosis not present

## 2023-11-07 NOTE — Progress Notes (Signed)
 Subjective:   Diamond Jackson is a 68 y.o. who presents for a Medicare Wellness preventive visit.  As a reminder, Annual Wellness Visits don't include a physical exam, and some assessments may be limited, especially if this visit is performed virtually. We may recommend an in-person follow-up visit with your provider if needed.  Visit Complete: Virtual I connected with  Diamond Jackson on 11/07/23 by a video and audio enabled telemedicine application and verified that I am speaking with the correct person using two identifiers.  Patient Location: Home  Provider Location: Home Office  I discussed the limitations of evaluation and management by telemedicine. The patient expressed understanding and agreed to proceed.  Vital Signs: Because this visit was a virtual/telehealth visit, some criteria may be missing or patient reported. Any vitals not documented were not able to be obtained and vitals that have been documented are patient reported.   Persons Participating in Visit: Patient.  AWV Questionnaire: No: Patient Medicare AWV questionnaire was not completed prior to this visit.  Cardiac Risk Factors include: advanced age (>52men, >37 women);diabetes mellitus;dyslipidemia;smoking/ tobacco exposure;obesity (BMI >30kg/m2)     Objective:     Today's Vitals   11/07/23 1442  Weight: 190 lb (86.2 kg)  Height: 5\' 5"  (1.651 m)   Body mass index is 31.62 kg/m.     11/07/2023    3:02 PM 11/01/2022    2:35 PM 09/29/2019    8:06 AM 04/29/2019    8:03 AM 04/10/2016    9:35 AM  Advanced Directives  Does Patient Have a Medical Advance Directive? No No No No No  Would patient like information on creating a medical advance directive? Yes (MAU/Ambulatory/Procedural Areas - Information given) No - Patient declined No - Patient declined No - Patient declined     Current Medications (verified) Outpatient Encounter Medications as of 11/07/2023  Medication Sig   albuterol  (VENTOLIN  HFA) 108 (90  Base) MCG/ACT inhaler Inhale 2 puffs into the lungs every 4 (four) hours as needed for wheezing or shortness of breath.   ANORO ELLIPTA 62.5-25 MCG/ACT AEPB 1 puff daily.   atorvastatin  (LIPITOR) 10 MG tablet Take 1 tablet (10 mg total) by mouth daily.   blood glucose meter kit and supplies Dispense based on patient and insurance preference. Use up to four times daily as directed. (FOR ICD-10 E10.9, E11.9).   cetirizine (ZYRTEC) 10 MG tablet Take 10 mg by mouth daily as needed for allergies.   metFORMIN  (GLUCOPHAGE -XR) 500 MG 24 hr tablet Take 1 tablet (500 mg total) by mouth daily with breakfast.   montelukast  (SINGULAIR ) 10 MG tablet Take 10 mg by mouth at bedtime.   OPCON-A 0.027-0.315 % SOLN For eyes   pantoprazole  (PROTONIX ) 40 MG tablet Take 1 tablet (40 mg total) by mouth every morning.   No facility-administered encounter medications on file as of 11/07/2023.    Allergies (verified) Patient has no known allergies.   History: Past Medical History:  Diagnosis Date   Arthritis    right ankle   Asthma    rare- (in very dusty conditions)   Cataract 12/11/2019   both eyes   Centrilobular emphysema (HCC)    GERD (gastroesophageal reflux disease)    History of measles, mumps, or rubella 03/22/2015   DID have Chicken Pox. DID have Measles. DID have Mumps. DID have Rubella.     PONV (postoperative nausea and vomiting)    Past Surgical History:  Procedure Laterality Date   ANKLE SURGERY Right 1984  BREAST BIOPSY Left    benign two areas/clips   CHOLECYSTECTOMY  2010   CLEFT PALATE REPAIR     as an infant   COLONOSCOPY  2010   normal   COLONOSCOPY WITH PROPOFOL  N/A 05/17/2018   Procedure: COLONOSCOPY WITH BIOPSIES;  Surgeon: Marnee Sink, MD;  Location: [facility];  Service: Endoscopy;  Laterality: N/A;   COLONOSCOPY WITH PROPOFOL  N/A 09/29/2019   Procedure: COLONOSCOPY WITH BIOPSY;  Surgeon: Marnee Sink, MD;  Location: [facility];  Service: Endoscopy;   Laterality: N/A;  priority 3   COLONOSCOPY WITH PROPOFOL  N/A 10/09/2022   Procedure: COLONOSCOPY WITH PROPOFOL ;  Surgeon: Marnee Sink, MD;  Location: [facility];  Service: Endoscopy;  Laterality: N/A;   POLYPECTOMY  05/17/2018   Procedure: POLYPECTOMY;  Surgeon: Marnee Sink, MD;  Location: [facility];  Service: Endoscopy;;   POLYPECTOMY N/A 09/29/2019   Procedure: POLYPECTOMY;  Surgeon: Marnee Sink, MD;  Location: [facility];  Service: Endoscopy;  Laterality: N/A;  Clips x 2 placed at site of Hepatic Flexure Polyp site removal   POLYPECTOMY  10/09/2022   Procedure: POLYPECTOMY;  Surgeon: Marnee Sink, MD;  Location: [facility];  Service: Endoscopy;;   Family History  Problem Relation Age of Onset   Diabetes Mother    Parkinson's disease Mother    Lung cancer Father    Diabetes Father    Brain cancer Father    Diabetes Brother    Breast cancer Maternal Aunt 28   Breast cancer Other 48   Social History   Socioeconomic History   Marital status: Widowed    Spouse name: Not on file   Number of children: 1   Years of education: Not on file   Highest education level: Bachelor's degree (e.g., BA, AB, BS)  Occupational History   Occupation: retired  Tobacco Use   Smoking status: Every Day    Current packs/day: 1.50    Average packs/day: 1.5 packs/day for 51.4 years (77.1 ttl pk-yrs)    Types: Cigarettes    Start date: 1974   Smokeless tobacco: Never   Tobacco comments:    11/07/23 smokes ~ 1/2 ppd  Vaping Use   Vaping status: Every Day   Substances: Nicotine, Flavoring  Substance and Sexual Activity   Alcohol use: No    Alcohol/week: 0.0 standard drinks of alcohol   Drug use: No   Sexual activity: Yes  Other Topics Concern   Not on file  Social History Narrative   Not on file   Social Drivers of Health   Financial Resource Strain: Low Risk  (11/07/2023)   Overall Financial Resource Strain (CARDIA)    Difficulty of Paying Living  Expenses: Not hard at all  Food Insecurity: No Food Insecurity (11/07/2023)   Hunger Vital Sign    Worried About Running Out of Food in the Last Year: Never true    Ran Out of Food in the Last Year: Never true  Transportation Needs: No Transportation Needs (11/07/2023)   PRAPARE - Administrator, Civil Service (Medical): No    Lack of Transportation (Non-Medical): No  Physical Activity: Sufficiently Active (11/07/2023)   Exercise Vital Sign    Days of Exercise per Week: 5 days    Minutes of Exercise per Session: 60 min  Stress: No Stress Concern Present (11/07/2023)   [employer] of Occupational Health - Occupational Stress Questionnaire    Feeling of Stress : Not at all  Social Connections: Moderately Isolated (  11/07/2023)   Social Connection and Isolation Panel [NHANES]    Frequency of Communication with Friends and Family: More than three times a week    Frequency of Social Gatherings with Friends and Family: More than three times a week    Attends Religious Services: More than 4 times per year    Active Member of [employer] or Organizations: No    Attends Banker Meetings: Never    Marital Status: Widowed    Tobacco Counseling Ready to quit: No Counseling given: No Tobacco comments: 11/07/23 smokes ~ 1/2 ppd    Clinical Intake:  Pre-visit preparation completed: Yes  Pain : No/denies pain     BMI - recorded: 31.62 Nutritional Status: BMI > 30  Obese Nutritional Risks: None Diabetes: Yes CBG done?: No (FBS 93 per patient) Did pt. bring in CBG monitor from home?: No  Lab Results  Component Value Date   HGBA1C 6.5 (H) 09/03/2023   HGBA1C 6.2 (H) 03/28/2023   HGBA1C 7.3 (H) 08/31/2022     How often do you need to have someone help you when you read instructions, pamphlets, or other written materials from your doctor or pharmacy?: 1 - Never  Interpreter Needed?: No  Information entered by :: Jaunita Messier, CMA   Activities of Daily Living      11/07/2023    2:45 PM  In your present state of health, do you have any difficulty performing the following activities:  Hearing? 0  Vision? 0  Difficulty concentrating or making decisions? 0  Walking or climbing stairs? 0  Dressing or bathing? 0  Doing errands, shopping? 0  Preparing Food and eating ? N  Using the Toilet? N  In the past six months, have you accidently leaked urine? Y  Comment wears pad  Do you have problems with loss of bowel control? N  Managing your Medications? N  Managing your Finances? N  Housekeeping or managing your Housekeeping? N    Patient Care Team: Sheron Dixons, MD as PCP - General (Internal Medicine) Rosa College, MD as Consulting Physician (Ophthalmology) Marnee Sink, MD as Consulting Physician (Gastroenterology) Rogers Clayman, MD as Referring Physician (Otolaryngology) Adelaida Holts, MD as Referring Physician (Pulmonary Disease) Antonette Batters, MD as Consulting Physician (Cardiology)  Indicate any recent Medical Services you may have received from other than [facility] providers in the past year (date may be approximate).     Assessment:    This is a routine wellness examination for Diamond Jackson.  Hearing/Vision screen Hearing Screening - Comments:: Denies hearing loss Vision Screening - Comments:: Has DM eye exam scheduled in July 2025 (goes every 2 years), [place], [place]   Goals Addressed             This Visit's Progress    Patient Stated       Lose 20 lbs and come off of Metformin        Depression Screen     11/07/2023    2:58 PM 09/03/2023    8:12 AM 07/30/2023    8:55 AM 07/24/2023    4:10 PM 03/28/2023    4:06 PM 11/01/2022    2:33 PM 08/30/2022   10:07 AM  PHQ 2/9 Scores  PHQ - 2 Score 1 0 0 0 0 0 0  PHQ- 9 Score 2 0 0 0 0 0 0    Fall Risk     11/07/2023    3:04 PM 07/30/2023    8:55 AM 07/24/2023  4:10 PM 03/28/2023    4:06 PM 11/01/2022    2:35 PM  Fall Risk   Falls in the past year?  1 0 0 1 1  Number falls in past yr: 1 0 0 1 0  Injury with Fall? 1 0 0 0 0  Comment     twisted knee  Risk for fall due to : History of fall(s);Orthopedic patient;Impaired balance/gait No Fall Risks No Fall Risks History of fall(s) History of fall(s)  Follow up Falls evaluation completed;Education provided Falls evaluation completed Falls evaluation completed Falls evaluation completed Falls prevention discussed;Falls evaluation completed    MEDICARE RISK AT HOME:  Medicare Risk at Home Any stairs in or around the home?: Yes If so, are there any without handrails?: No Home free of loose throw rugs in walkways, pet beds, electrical cords, etc?: Yes Adequate lighting in your home to reduce risk of falls?: Yes Life alert?: No Use of a cane, walker or w/c?: No Grab bars in the bathroom?: Yes Shower chair or bench in shower?: No Elevated toilet seat or a handicapped toilet?: Yes  TIMED UP AND GO:  Was the test performed?  No  Cognitive Function: 6CIT completed        11/07/2023    3:06 PM 11/01/2022    2:51 PM  6CIT Screen  What Year? 0 points 0 points  What month? 0 points 0 points  What time? 0 points 0 points  Count back from 20 0 points 0 points  Months in reverse 0 points 0 points  Repeat phrase 0 points 0 points  Total Score 0 points 0 points    Immunizations Immunization History  Administered Date(s) Administered   Fluad Trivalent(High Dose 65+) 03/28/2023   Influenza,inj,Quad PF,6+ Mos 04/15/2018, 04/17/2019, 04/20/2020   Influenza-Unspecified 03/30/2017, 04/12/2021, 04/03/2022   PFIZER(Purple Top)SARS-COV-2 Vaccination 09/18/2019, 10/14/2019, 05/27/2020   PNEUMOCOCCAL CONJUGATE-20 04/22/2021   Tdap 04/17/2013   Zoster Recombinant(Shingrix) 05/29/2019, 08/27/2019    Screening Tests Health Maintenance  Topic Date Due   OPHTHALMOLOGY EXAM  Never done   COVID-19 Vaccine (4 - 2024-25 season) 02/11/2023   DTaP/Tdap/Td (2 - Td or Tdap) 04/18/2023   INFLUENZA  VACCINE  01/11/2024   Lung Cancer Screening  01/22/2024   HEMOGLOBIN A1C  03/05/2024   Diabetic kidney evaluation - eGFR measurement  09/02/2024   Diabetic kidney evaluation - Urine ACR  09/02/2024   FOOT EXAM  09/02/2024   MAMMOGRAM  09/25/2024   Medicare Annual Wellness (AWV)  11/06/2024   Colonoscopy  10/09/2027   Pneumonia Vaccine 78+ Years old  Completed   DEXA SCAN  Completed   Zoster Vaccines- Shingrix  Completed   Hepatitis C Screening  Addressed   HPV VACCINES  Aged Out   Meningococcal B Vaccine  Aged Out    Health Maintenance  Health Maintenance Due  Topic Date Due   OPHTHALMOLOGY EXAM  Never done   COVID-19 Vaccine (4 - 2024-25 season) 02/11/2023   DTaP/Tdap/Td (2 - Td or Tdap) 04/18/2023   Health Maintenance Items Addressed: See Nurse Notes  Additional Screening:  Vision Screening: Recommended annual ophthalmology exams for early detection of glaucoma and other disorders of the eye.  Dental Screening: Recommended annual dental exams for proper oral hygiene  Community Resource Referral / Chronic Care Management: CRR required this visit?  No   CCM required this visit?  No   Plan:    I have personally reviewed and noted the following in the patient's chart:   Medical and  social history Use of alcohol, tobacco or illicit drugs  Current medications and supplements including opioid prescriptions. Patient is not currently taking opioid prescriptions. Functional ability and status Nutritional status Physical activity Advanced directives List of other physicians Hospitalizations, surgeries, and ER visits in previous 12 months Vitals Screenings to include cognitive, depression, and falls Referrals and appointments  In addition, I have reviewed and discussed with patient certain preventive protocols, quality metrics, and best practice recommendations. A written personalized care plan for preventive services as well as general preventive health  recommendations were provided to patient.   Jaunita Messier, CMA   11/07/2023   After Visit Summary: (MyChart) Due to this being a telephonic visit, the after visit summary with patients personalized plan was offered to patient via MyChart   Notes: Please refer to Routing Comments.

## 2023-11-07 NOTE — Patient Instructions (Addendum)
 Diamond Jackson , Thank you for taking time out of your busy schedule to complete your Annual Wellness Visit with me. I enjoyed our conversation and look forward to speaking with you again next year. I, as well as your care team,  appreciate your ongoing commitment to your health goals. Please review the following plan we discussed and let me know if I can assist you in the future. Your Game plan/ To Do List    Referrals: I have placed a referral to diabetes and nutrition education at [facility]. Someone should call you. If you haven't heard from the office you've been referred to, please reach out to them at the phone provided. Phone # [phone]  Follow up Visits: Next Medicare AWV with our clinical staff: 11/12/24 @ 2:40pm (video visit)   Have you seen your provider in the last 6 months (3 months if uncontrolled diabetes)? Yes Next Office Visit with your provider: 01/03/24 @ 8:40am with Dr. Gala Jubilee  Clinician Recommendations:  Aim for 30 minutes of exercise or brisk walking, 6-8 glasses of water , and 5 servings of fruits and vegetables each day.       This is a list of the screening recommended for you and due dates:  Health Maintenance  Topic Date Due   Eye exam for diabetics  Never done   COVID-19 Vaccine (4 - 2024-25 season) 02/11/2023   DTaP/Tdap/Td vaccine (2 - Td or Tdap) 04/18/2023   Flu Shot  01/11/2024   Screening for Lung Cancer  01/22/2024   Hemoglobin A1C  03/05/2024   Yearly kidney function blood test for diabetes  09/02/2024   Yearly kidney health urinalysis for diabetes  09/02/2024   Complete foot exam   09/02/2024   Mammogram  09/25/2024   Medicare Annual Wellness Visit  11/06/2024   Colon Cancer Screening  10/09/2027   Pneumonia Vaccine  Completed   DEXA scan (bone density measurement)  Completed   Zoster (Shingles) Vaccine  Completed   Hepatitis C Screening  Addressed   HPV Vaccine  Aged Out   Meningitis B Vaccine  Aged Out    Advanced directives: (ACP Link)Information  on Advanced Care Planning can be found at Publishing rights manager Advance Health Care Directives Advance Health Care Directives. [email or web address] You may also get these forms at your doctor's office. Advance Care Planning is important because it:  [x]  Makes sure you receive the medical care that is consistent with your values, goals, and preferences  [x]  It provides guidance to your family and loved ones and reduces their decisional burden about whether or not they are making the right decisions based on your wishes.  Follow the link provided in your after visit summary or read over the paperwork we have mailed to you to help you started getting your Advance Directives in place. If you need assistance in completing these, please reach out to us  so that we can help you!  See attachments for Preventive Care and Fall Prevention Tips.   Fall Prevention in the Home, Adult Falls can cause injuries and affect people of all ages. There are many simple things that you can do to make your home safe and to help prevent falls. If you need it, ask for help making these changes. What actions can I take to prevent falls? General information Use good lighting in all rooms. Make sure to: Replace any light bulbs that burn out. Turn on lights if it is dark and use night-lights. Keep items that you use often  in easy-to-reach places. Lower the shelves around your home if needed. Move furniture so that there are clear paths around it. Do not keep throw rugs or other things on the floor that can make you trip. If any of your floors are uneven, fix them. Add color or contrast paint or tape to clearly mark and help you see: Grab bars or handrails. First and last steps of staircases. Where the edge of each step is. If you use a ladder or stepladder: Make sure that it is fully opened. Do not climb a closed ladder. Make sure the sides of the ladder are locked in place. Have someone hold the ladder while you use  it. Know where your pets are as you move through your home. What can I do in the bathroom?     Keep the floor dry. Clean up any water  that is on the floor right away. Remove soap buildup in the bathtub or shower. Buildup makes bathtubs and showers slippery. Use non-skid mats or decals on the floor of the bathtub or shower. Attach bath mats securely with double-sided, non-slip rug tape. If you need to sit down while you are in the shower, use a non-slip stool. Install grab bars by the toilet and in the bathtub and shower. Do not use towel bars as grab bars. What can I do in the bedroom? Make sure that you have a light by your bed that is easy to reach. Do not use any sheets or blankets on your bed that hang to the floor. Have a firm bench or chair with side arms that you can use for support when you get dressed. What can I do in the kitchen? Clean up any spills right away. If you need to reach something above you, use a sturdy step stool that has a grab bar. Keep electrical cables out of the way. Do not use floor polish or wax that makes floors slippery. What can I do with my stairs? Do not leave anything on the stairs. Make sure that you have a light switch at the top and the bottom of the stairs. Have them installed if you do not have them. Make sure that there are handrails on both sides of the stairs. Fix handrails that are broken or loose. Make sure that handrails are as long as the staircases. Install non-slip stair treads on all stairs in your home if they do not have carpet. Avoid having throw rugs at the top or bottom of stairs, or secure the rugs with carpet tape to prevent them from moving. Choose a carpet design that does not hide the edge of steps on the stairs. Make sure that carpet is firmly attached to the stairs. Fix any carpet that is loose or worn. What can I do on the outside of my home? Use bright outdoor lighting. Repair the edges of walkways and driveways and fix  any cracks. Clear paths of anything that can make you trip, such as tools or rocks. Add color or contrast paint or tape to clearly mark and help you see high doorway thresholds. Trim any bushes or trees on the main path into your home. Check that handrails are securely fastened and in good repair. Both sides of all steps should have handrails. Install guardrails along the edges of any raised decks or porches. Have leaves, snow, and ice cleared regularly. Use sand, salt, or ice melt on walkways during winter months if you live where there is ice and snow. In the  garage, clean up any spills right away, including grease or oil spills. What other actions can I take? Review your medicines with your health care provider. Some medicines can make you confused or feel dizzy. This can increase your chance of falling. Wear closed-toe shoes that fit well and support your feet. Wear shoes that have rubber soles and low heels. Use a cane, walker, scooter, or crutches that help you move around if needed. Talk with your provider about other ways that you can decrease your risk of falls. This may include seeing a physical therapist to learn to do exercises to improve movement and strength. Where to find more information Centers for Disease Control and Prevention, STEADI: TonerPromos.no [employer] on Aging: BaseRingTones.pl National Institute on Aging: BaseRingTones.pl Contact a health care provider if: You are afraid of falling at home. You feel weak, drowsy, or dizzy at home. You fall at home. Get help right away if you: Lose consciousness or have trouble moving after a fall. Have a fall that causes a head injury. These symptoms may be an emergency. Get help right away. Call 911. Do not wait to see if the symptoms will go away. Do not drive yourself to the hospital. This information is not intended to replace advice given to you by your health care provider. Make sure you discuss any questions you have with your  health care provider. Document Revised: 01/30/2022 Document Reviewed: 01/30/2022 Elsevier Patient Education  2024 [employer].

## 2023-11-11 DIAGNOSIS — E119 Type 2 diabetes mellitus without complications: Secondary | ICD-10-CM | POA: Diagnosis not present

## 2023-11-22 ENCOUNTER — Inpatient Hospital Stay: Admission: RE | Admit: 2023-11-22 | Source: Ambulatory Visit

## 2023-11-22 DIAGNOSIS — Z9889 Other specified postprocedural states: Secondary | ICD-10-CM | POA: Diagnosis not present

## 2023-11-22 DIAGNOSIS — X58XXXD Exposure to other specified factors, subsequent encounter: Secondary | ICD-10-CM | POA: Diagnosis not present

## 2023-11-22 DIAGNOSIS — S52502D Unspecified fracture of the lower end of left radius, subsequent encounter for closed fracture with routine healing: Secondary | ICD-10-CM | POA: Diagnosis not present

## 2023-12-11 DIAGNOSIS — E119 Type 2 diabetes mellitus without complications: Secondary | ICD-10-CM | POA: Diagnosis not present

## 2023-12-24 NOTE — Progress Notes (Signed)
 Diabetes Self-Management Education  Visit Type: First/Initial  Appt. Start Time: 1515  Appt. End Time: 1619  12/31/2023  Ms. Diamond Jackson, identified by name and date of birth, is a 68 y.o. female with a diagnosis of Diabetes: Type 2.   ASSESSMENT  Patient is here today alone. Patient would like to learn to eat healthier for diabetes. Pt desires to control diabetes with less medications. Patient lives alone. Pt reports she does shopping and cooking. Pt reports eating out about twice monthly. Pt report she is retired. Pt reports typical intake of 2 meals with 1-2 snacks. Pt reports she increased her physical activity for the past year or more. Pt reports she joined weight watchers over a year ago and states she weight is stuck. Pt reports she desires weight reductions to a lower BMI range. Pt reports monitoring her blood sugar upon waking with recent values ranging 90-104 mg/dL. All Pt's questions were answered during this encounter.  History includes:   Past Medical History:  Diagnosis Date   Arthritis    right ankle   Asthma    rare- (in very dusty conditions)   Cataract 12/11/2019   both eyes   Centrilobular emphysema (HCC)    GERD (gastroesophageal reflux disease)    History of measles, mumps, or rubella 03/22/2015   DID have Chicken Pox. DID have Measles. DID have Mumps. DID have Rubella.     PONV (postoperative nausea and vomiting)     Medications include:   Current Outpatient Medications:    albuterol  (VENTOLIN  HFA) 108 (90 Base) MCG/ACT inhaler, Inhale 2 puffs into the lungs every 4 (four) hours as needed for wheezing or shortness of breath., Disp: 18 g, Rfl: 5   ANORO ELLIPTA 62.5-25 MCG/ACT AEPB, 1 puff daily., Disp: , Rfl:    atorvastatin  (LIPITOR) 10 MG tablet, Take 1 tablet (10 mg total) by mouth daily., Disp: 90 tablet, Rfl: 1   blood glucose meter kit and supplies, Dispense based on patient and insurance preference. Use up to four times daily as directed. (FOR ICD-10  E10.9, E11.9)., Disp: 1 each, Rfl: 0   cetirizine (ZYRTEC) 10 MG tablet, Take 10 mg by mouth daily as needed for allergies., Disp: , Rfl:    metFORMIN  (GLUCOPHAGE -XR) 500 MG 24 hr tablet, Take 1 tablet (500 mg total) by mouth daily with breakfast., Disp: 90 tablet, Rfl: 1   montelukast  (SINGULAIR ) 10 MG tablet, Take 10 mg by mouth at bedtime., Disp: , Rfl:    OPCON-A 0.027-0.315 % SOLN, For eyes, Disp: , Rfl:    pantoprazole  (PROTONIX ) 40 MG tablet, Take 1 tablet (40 mg total) by mouth every morning., Disp: 90 tablet, Rfl: 1   Labs noted:   Lab Results  Component Value Date   HGBA1C 6.5 (H) 09/03/2023   Wt Readings from Last 3 Encounters:  12/31/23 189 lb 8 oz (86 kg)  11/07/23 190 lb (86.2 kg)  09/03/23 195 lb 8 oz (88.7 kg)   Weight 189 lb 8 oz (86 kg). Body mass index is 31.53 kg/m.   Diabetes Self-Management Education - 12/31/23 1523       Visit Information   Visit Type First/Initial      Initial Visit   Diabetes Type Type 2    Date Diagnosed 2024    Are you currently following a meal plan? No    Are you taking your medications as prescribed? Yes      Health Coping   How would you rate your overall health? Good  Psychosocial Assessment   Patient Belief/Attitude about Diabetes Motivated to manage diabetes    What is the hardest part about your diabetes right now, causing you the most concern, or is the most worrisome to you about your diabetes?   Making healty food and beverage choices    Self-care barriers None    Self-management support Doctor's office    Other persons present Patient    Patient Concerns Nutrition/Meal planning;Medication    Special Needs None    Preferred Learning Style No preference indicated    Learning Readiness Change in progress    How often do you need to have someone help you when you read instructions, pamphlets, or other written materials from your doctor or pharmacy? 1 - Never    What is the last grade level you completed in school?  college      Pre-Education Assessment   Patient understands the diabetes disease and treatment process. Needs Review    Patient understands incorporating nutritional management into lifestyle. Needs Instruction    Patient undertands incorporating physical activity into lifestyle. Comprehends key points    Patient understands using medications safely. Comprehends key points    Patient understands monitoring blood glucose, interpreting and using results Comprehends key points    Patient understands prevention, detection, and treatment of acute complications. Needs Review    Patient understands prevention, detection, and treatment of chronic complications. Needs Review    Patient understands how to develop strategies to address psychosocial issues. Needs Review    Patient understands how to develop strategies to promote health/change behavior. Needs Review      Complications   Last HgB A1C per patient/outside source 6.5 %    How often do you check your blood sugar? 1-2 times/day    Fasting Blood glucose range (mg/dL) 29-870    Postprandial Blood glucose range (mg/dL) 869-820    Number of hypoglycemic episodes per month 0    Number of hyperglycemic episodes ( >200mg /dL): Never    Can you tell when your blood sugar is high? No    Have you had a dilated eye exam in the past 12 months? No    Have you had a dental exam in the past 12 months? Yes    Are you checking your feet? Yes    How many days per week are you checking your feet? 4      Dietary Intake   Snack (morning) brunch 11am: eggs, sausage, 2 slices of whole grain toast with butter, coffee with stevia and half half or 3/4 great grains or speical K berries cereal with 2% milk    Snack (afternoon) ~3pm: peanut butter, 6 crackers or melon or peach    Dinner ~6pm: meat loaf and mashed potatoes, unsweetened tea, water     Snack (evening) dannon light and fit yogurt or almonds    Beverage(s) coffee with stevia and half half, unsweetened  tea, water , sparkling water , diet snapple      Activity / Exercise   Activity / Exercise Type Light (walking / raking leaves);ADL's;Moderate (swimming / aerobic walking)    How many days per week do you exercise? 6    How many minutes per day do you exercise? 50    Total minutes per week of exercise 300      Patient Education   Previous Diabetes Education No    Disease Pathophysiology Definition of diabetes, type 1 and 2, and the diagnosis of diabetes    Healthy Eating Role of diet in the  treatment of diabetes and the relationship between the three main macronutrients and blood glucose level;Food label reading, portion sizes and measuring food.;Plate Method;Reviewed blood glucose goals for pre and post meals and how to evaluate the patients' food intake on their blood glucose level.    Being Active Role of exercise on diabetes management, blood pressure control and cardiac health.    Medications Reviewed patients medication for diabetes, action, purpose, timing of dose and side effects.    Monitoring Identified appropriate SMBG and/or A1C goals.    Acute complications Discussed and identified patients' prevention, symptoms, and treatment of hyperglycemia.    Chronic complications Relationship between chronic complications and blood glucose control;Retinopathy and reason for yearly dilated eye exams    Diabetes Stress and Support Identified and addressed patients feelings and concerns about diabetes;Worked with patient to identify barriers to care and solutions    Preconception care --   n/a   Lifestyle and Health Coping Lifestyle issues that need to be addressed for better diabetes care      Individualized Goals (developed by patient)   Nutrition Follow meal plan discussed    Physical Activity Exercise 5-7 days per week;60 minutes per day    Medications take my medication as prescribed    Monitoring  Test my blood glucose as discussed    Problem Solving Addressing barriers to behavior  change;Eating Pattern;Social situations    Reducing Risk do foot checks daily    Health Coping Ask for help with psychological, social, or emotional issues      Post-Education Assessment   Patient understands the diabetes disease and treatment process. Needs Review    Patient understands incorporating nutritional management into lifestyle. Needs Review    Patient undertands incorporating physical activity into lifestyle. Comprehends key points    Patient understands using medications safely. Comphrehends key points    Patient understands monitoring blood glucose, interpreting and using results Comprehends key points    Patient understands prevention, detection, and treatment of acute complications. Comprehends key points    Patient understands prevention, detection, and treatment of chronic complications. Comprehends key points    Patient understands how to develop strategies to address psychosocial issues. Comprehends key points    Patient understands how to develop strategies to promote health/change behavior. Needs Review      Outcomes   Expected Outcomes Demonstrated interest in learning. Expect positive outcomes    Future DMSE 3-4 months    Program Status Not Completed          Individualized Plan for Diabetes Self-Management Training:   Learning Objective:  Patient will have a greater understanding of diabetes self-management. Patient education plan is to attend individual and/or group sessions per assessed needs and concerns.   Plan:   Patient Instructions  Avoid skipping breakfast; aim for 3 balanced meals daily  Continue with your physical activity participation-Great job!     Expected Outcomes:  Demonstrated interest in learning. Expect positive outcomes  Education material provided: ADA - How to Thrive: A Guide for Your Journey with Diabetes, My Plate, Snack sheet, Support group flyer, and Diabetes Resources  If problems or questions, patient to contact team via:   Phone  Future DSME appointment: 3-4 months

## 2023-12-26 ENCOUNTER — Ambulatory Visit
Admission: RE | Admit: 2023-12-26 | Discharge: 2023-12-26 | Disposition: A | Discharge status: Home / Self Care | Source: Ambulatory Visit | Attending: Internal Medicine | Admitting: Internal Medicine

## 2023-12-26 ENCOUNTER — Ambulatory Visit: Payer: Self-pay | Admitting: Internal Medicine

## 2023-12-26 DIAGNOSIS — Z13820 Encounter for screening for osteoporosis: Secondary | ICD-10-CM | POA: Insufficient documentation

## 2023-12-26 DIAGNOSIS — S62102A Fracture of unspecified carpal bone, left wrist, initial encounter for closed fracture: Secondary | ICD-10-CM | POA: Diagnosis not present

## 2023-12-26 DIAGNOSIS — M8589 Other specified disorders of bone density and structure, multiple sites: Secondary | ICD-10-CM | POA: Diagnosis not present

## 2023-12-26 DIAGNOSIS — X58XXXS Exposure to other specified factors, sequela: Secondary | ICD-10-CM | POA: Insufficient documentation

## 2023-12-26 DIAGNOSIS — Z780 Asymptomatic menopausal state: Secondary | ICD-10-CM | POA: Diagnosis not present

## 2023-12-26 DIAGNOSIS — S62102S Fracture of unspecified carpal bone, left wrist, sequela: Secondary | ICD-10-CM | POA: Diagnosis not present

## 2023-12-31 ENCOUNTER — Encounter: Attending: Internal Medicine | Admitting: Dietician

## 2023-12-31 VITALS — Wt 189.5 lb

## 2023-12-31 DIAGNOSIS — Z713 Dietary counseling and surveillance: Secondary | ICD-10-CM | POA: Diagnosis not present

## 2023-12-31 DIAGNOSIS — Z7984 Long term (current) use of oral hypoglycemic drugs: Secondary | ICD-10-CM | POA: Insufficient documentation

## 2023-12-31 DIAGNOSIS — Z6831 Body mass index (BMI) 31.0-31.9, adult: Secondary | ICD-10-CM | POA: Insufficient documentation

## 2023-12-31 DIAGNOSIS — E119 Type 2 diabetes mellitus without complications: Secondary | ICD-10-CM | POA: Insufficient documentation

## 2023-12-31 NOTE — Patient Instructions (Signed)
 Avoid skipping breakfast; aim for 3 balanced meals daily  Continue with your physical activity participation-Great job!

## 2024-01-03 ENCOUNTER — Encounter: Payer: Self-pay | Admitting: Internal Medicine

## 2024-01-03 ENCOUNTER — Ambulatory Visit: Admitting: Internal Medicine

## 2024-01-03 VITALS — BP 124/72 | HR 73 | Ht 65.0 in | Wt 190.0 lb

## 2024-01-03 DIAGNOSIS — J3089 Other allergic rhinitis: Secondary | ICD-10-CM

## 2024-01-03 DIAGNOSIS — E118 Type 2 diabetes mellitus with unspecified complications: Secondary | ICD-10-CM

## 2024-01-03 DIAGNOSIS — Z7984 Long term (current) use of oral hypoglycemic drugs: Secondary | ICD-10-CM | POA: Diagnosis not present

## 2024-01-03 LAB — POCT GLYCOSYLATED HEMOGLOBIN (HGB A1C): Hemoglobin A1C: 5.8 % — AB (ref 4.0–5.6)

## 2024-01-03 MED ORDER — MONTELUKAST SODIUM 10 MG PO TABS: 10.0000 mg | ORAL_TABLET | Freq: Every day | ORAL | 1 refills | Status: DC

## 2024-01-03 NOTE — Assessment & Plan Note (Addendum)
 Using Zyrtec daily and then Flonase and singulair  PRN No recent infections or worsening cough Baseline mild wheezing due to COPD/tobacco use

## 2024-01-03 NOTE — Assessment & Plan Note (Addendum)
 Blood sugars have been stable.  No hypoglycemic events. Currently medications are MTF.  No side effects reported. Lab Results  Component Value Date   HGBA1C 6.5 (H) 09/03/2023   Last visit no changes were made in the medical regimen. A1C = 5.8. Continue same MTF, diet and exercise.

## 2024-01-03 NOTE — Progress Notes (Signed)
 Date:  01/03/2024   Name:  Diamond Jackson   DOB:  1956/05/10   MRN:  [id number]   Chief Complaint: Diabetes  Diabetes She presents for her follow-up diabetic visit. She has type 2 diabetes mellitus. Her disease course has been stable. Pertinent negatives for hypoglycemia include no headaches or tremors. Pertinent negatives for diabetes include no chest pain, no fatigue, no polydipsia and no polyuria. Current diabetic treatment includes oral agent (monotherapy) (MTF). Her weight is stable. She is following a generally unhealthy diet. She participates in exercise daily. There is no change in her home blood glucose trend.    Review of Systems  Constitutional:  Negative for appetite change, fatigue, fever and unexpected weight change.  HENT:  Negative for tinnitus and trouble swallowing.   Eyes:  Negative for visual disturbance.  Respiratory:  Negative for cough, chest tightness and shortness of breath.   Cardiovascular:  Negative for chest pain, palpitations and leg swelling.  Gastrointestinal:  Negative for abdominal pain.  Endocrine: Negative for polydipsia and polyuria.  Genitourinary:  Negative for dysuria and hematuria.  Musculoskeletal:  Negative for arthralgias.  Neurological:  Negative for tremors, numbness and headaches.  Psychiatric/Behavioral:  Negative for dysphoric mood.      Lab Results  Component Value Date   NA 140 09/03/2023   K 4.2 09/03/2023   CO2 20 09/03/2023   GLUCOSE 98 09/03/2023   BUN 21 09/03/2023   CREATININE 0.97 09/03/2023   CALCIUM  9.3 09/03/2023   EGFR 64 09/03/2023   GFRNONAA 68 04/20/2020   Lab Results  Component Value Date   CHOL 125 09/03/2023   HDL 41 09/03/2023   LDLCALC 66 09/03/2023   TRIG 96 09/03/2023   CHOLHDL 3.0 09/03/2023   Lab Results  Component Value Date   TSH 0.685 09/03/2023   Lab Results  Component Value Date   HGBA1C 6.5 (H) 09/03/2023   Lab Results  Component Value Date   WBC 8.6 09/03/2023   HGB 14.1  09/03/2023   HCT 43.2 09/03/2023   MCV 88 09/03/2023   PLT 198 09/03/2023   Lab Results  Component Value Date   ALT 14 09/03/2023   AST 12 09/03/2023   ALKPHOS 91 09/03/2023   BILITOT 0.3 09/03/2023   No results found for: MARIEN BOLLS, VD25OH   Patient Active Problem List   Diagnosis Date Noted   Left wrist fracture, closed, initial encounter 09/03/2023   Hyperlipidemia associated with type 2 diabetes mellitus (HCC) 08/30/2022   Type II diabetes mellitus with complication (HCC) 10/20/2021   Coronary artery calcification seen on computed tomography 01/02/2021   Centrilobular emphysema (HCC) 06/01/2020   Aortic atherosclerosis (HCC) 06/01/2020   History of colonic polyps    Polyp of transverse colon    Benign neoplasm of cecum    Benign neoplasm of transverse colon    Benign neoplasm of rectosigmoid junction    Environmental and seasonal allergies 03/22/2015   Fibrocystic breast changes 03/22/2015   Hx of peptic ulcer 03/22/2015   Current tobacco use 03/22/2015    No Known Allergies  Past Surgical History:  Procedure Laterality Date   ANKLE SURGERY Right 1984   BREAST BIOPSY Left    benign two areas/clips   CHOLECYSTECTOMY  2010   CLEFT PALATE REPAIR     as an infant   COLONOSCOPY  2010   normal   COLONOSCOPY WITH PROPOFOL  N/A 05/17/2018   Procedure: COLONOSCOPY WITH BIOPSIES;  Surgeon: Jinny Carmine, MD;  Location: [facility];  Service: Endoscopy;  Laterality: N/A;   COLONOSCOPY WITH PROPOFOL  N/A 09/29/2019   Procedure: COLONOSCOPY WITH BIOPSY;  Surgeon: Jinny Carmine, MD;  Location: [facility];  Service: Endoscopy;  Laterality: N/A;  priority 3   COLONOSCOPY WITH PROPOFOL  N/A 10/09/2022   Procedure: COLONOSCOPY WITH PROPOFOL ;  Surgeon: Jinny Carmine, MD;  Location: [facility];  Service: Endoscopy;  Laterality: N/A;   POLYPECTOMY  05/17/2018   Procedure: POLYPECTOMY;  Surgeon: Jinny Carmine, MD;  Location: [facility];   Service: Endoscopy;;   POLYPECTOMY N/A 09/29/2019   Procedure: POLYPECTOMY;  Surgeon: Jinny Carmine, MD;  Location: [facility];  Service: Endoscopy;  Laterality: N/A;  Clips x 2 placed at site of Hepatic Flexure Polyp site removal   POLYPECTOMY  10/09/2022   Procedure: POLYPECTOMY;  Surgeon: Jinny Carmine, MD;  Location: [facility];  Service: Endoscopy;;    Social History   Tobacco Use   Smoking status: Every Day    Current packs/day: 1.50    Average packs/day: 1.5 packs/day for 51.6 years (77.3 ttl pk-yrs)    Types: Cigarettes    Start date: 1974   Smokeless tobacco: Never   Tobacco comments:    11/07/23 smokes ~ 1/2 ppd  Vaping Use   Vaping status: Every Day   Substances: Nicotine, Flavoring  Substance Use Topics   Alcohol use: No    Alcohol/week: 0.0 standard drinks of alcohol   Drug use: No     Medication list has been reviewed and updated.  Current Meds  Medication Sig   albuterol  (VENTOLIN  HFA) 108 (90 Base) MCG/ACT inhaler Inhale 2 puffs into the lungs every 4 (four) hours as needed for wheezing or shortness of breath.   ANORO ELLIPTA 62.5-25 MCG/ACT AEPB 1 puff daily.   atorvastatin  (LIPITOR) 10 MG tablet Take 1 tablet (10 mg total) by mouth daily.   blood glucose meter kit and supplies Dispense based on patient and insurance preference. Use up to four times daily as directed. (FOR ICD-10 E10.9, E11.9).   cetirizine (ZYRTEC) 10 MG tablet Take 10 mg by mouth daily as needed for allergies.   metFORMIN  (GLUCOPHAGE -XR) 500 MG 24 hr tablet Take 1 tablet (500 mg total) by mouth daily with breakfast.   OPCON-A 0.027-0.315 % SOLN For eyes   pantoprazole  (PROTONIX ) 40 MG tablet Take 1 tablet (40 mg total) by mouth every morning.   [DISCONTINUED] montelukast  (SINGULAIR ) 10 MG tablet Take 10 mg by mouth at bedtime.       01/03/2024    8:57 AM 09/03/2023    8:12 AM 07/30/2023    8:55 AM 07/24/2023    4:10 PM  GAD 7 : Generalized Anxiety Score  Nervous, Anxious,  on Edge 0 0 0 0  Control/stop worrying 0 0 0 0  Worry too much - different things 0 0 0 0  Trouble relaxing 0 0 0 0  Restless 0 0 0 0  Easily annoyed or irritable 0 0 0 0  Afraid - awful might happen 0 0 0 0  Total GAD 7 Score 0 0 0 0  Anxiety Difficulty Not difficult at all Not difficult at all Not difficult at all Not difficult at all       01/03/2024    8:57 AM 12/31/2023    3:22 PM 11/07/2023    2:58 PM  Depression screen PHQ 2/9  Decreased Interest 0 0 0  Down, Depressed, Hopeless 0 0 1  PHQ - 2 Score 0 0  1  Altered sleeping 0  0  Tired, decreased energy 1  1  Change in appetite 0  0  Feeling bad or failure about yourself  0  0  Trouble concentrating 0  0  Moving slowly or fidgety/restless 0  0  Suicidal thoughts 0  0  PHQ-9 Score 1  2  Difficult doing work/chores Not difficult at all      BP Readings from Last 3 Encounters:  01/03/24 124/72  09/03/23 120/70  07/30/23 118/70    Physical Exam Vitals and nursing note reviewed.  Constitutional:      General: She is not in acute distress.    Appearance: She is well-developed.  HENT:     Head: Normocephalic and atraumatic.  Neck:     Vascular: No carotid bruit.  Cardiovascular:     Rate and Rhythm: Normal rate and regular rhythm.  Pulmonary:     Effort: Pulmonary effort is normal. No respiratory distress.     Breath sounds: Transmitted upper airway sounds present. Examination of the right-upper field reveals wheezing. Examination of the left-upper field reveals wheezing. Decreased breath sounds, wheezing and rhonchi present.  Lymphadenopathy:     Cervical: No cervical adenopathy.  Skin:    General: Skin is warm and dry.     Findings: No rash.  Neurological:     Mental Status: She is alert and oriented to person, place, and time.  Psychiatric:        Mood and Affect: Mood normal.        Behavior: Behavior normal.     Wt Readings from Last 3 Encounters:  01/03/24 190 lb (86.2 kg)  12/31/23 189 lb 8 oz (86  kg)  11/07/23 190 lb (86.2 kg)    BP 124/72   Pulse 73   Ht 5' 5 (1.651 m)   Wt 190 lb (86.2 kg)   SpO2 96%   BMI 31.62 kg/m   Assessment and Plan:  Problem List Items Addressed This Visit       Unprioritized   Environmental and seasonal allergies   Using Zyrtec daily and then Flonase and singulair  PRN No recent infections or worsening cough Baseline mild wheezing due to COPD/tobacco use      Relevant Medications   montelukast  (SINGULAIR ) 10 MG tablet   Type II diabetes mellitus with complication (HCC) - Primary (Chronic)   Blood sugars have been stable.  No hypoglycemic events. Currently medications are MTF.  No side effects reported. Lab Results  Component Value Date   HGBA1C 6.5 (H) 09/03/2023   Last visit no changes were made in the medical regimen. A1C = 5.8. Continue same MTF, diet and exercise.       Relevant Orders   POCT glycosylated hemoglobin (Hb A1C)   Other Visit Diagnoses       Long term current use of oral hypoglycemic drug           Return in about 4 months (around 05/05/2024) for DM.    Leita HILARIO Adie, MD [facility] [place]

## 2024-01-11 DIAGNOSIS — E119 Type 2 diabetes mellitus without complications: Secondary | ICD-10-CM | POA: Diagnosis not present

## 2024-01-18 ENCOUNTER — Other Ambulatory Visit: Payer: Self-pay | Admitting: Internal Medicine

## 2024-01-21 ENCOUNTER — Other Ambulatory Visit: Payer: Self-pay | Admitting: Internal Medicine

## 2024-01-21 DIAGNOSIS — E118 Type 2 diabetes mellitus with unspecified complications: Secondary | ICD-10-CM

## 2024-01-22 NOTE — Telephone Encounter (Signed)
 Requested Prescriptions  Pending Prescriptions Disp Refills   pantoprazole  (PROTONIX ) 40 MG tablet [Pharmacy Med Name: Pantoprazole  Sodium 40 MG Oral Tablet Delayed Release] 90 tablet 1    Sig: TAKE 1 TABLET BY MOUTH ONCE DAILY IN THE MORNING     Gastroenterology: Proton Pump Inhibitors Passed - 01/22/2024  2:08 PM      Passed - Valid encounter within last 12 months    Recent Outpatient Visits           2 weeks ago Type II diabetes mellitus with complication [facility])   [place] [facility] at [facility], Leita DEL, MD   4 months ago Annual physical exam   [facility] at [facility], Leita DEL, MD   5 months ago Type II diabetes mellitus with complication [facility])   [place] [facility] Medicine at [facility], Leita DEL, MD   6 months ago Acute non-recurrent maxillary sinusitis   [facility] at [facility], Leita DEL, MD

## 2024-01-23 ENCOUNTER — Ambulatory Visit
Admission: RE | Admit: 2024-01-23 | Discharge: 2024-01-23 | Disposition: A | Discharge status: Home / Self Care | Source: Ambulatory Visit | Attending: Acute Care | Admitting: Acute Care

## 2024-01-23 DIAGNOSIS — Z122 Encounter for screening for malignant neoplasm of respiratory organs: Secondary | ICD-10-CM | POA: Insufficient documentation

## 2024-01-23 DIAGNOSIS — F17210 Nicotine dependence, cigarettes, uncomplicated: Secondary | ICD-10-CM | POA: Insufficient documentation

## 2024-01-23 DIAGNOSIS — Z87891 Personal history of nicotine dependence: Secondary | ICD-10-CM | POA: Diagnosis not present

## 2024-01-24 NOTE — Telephone Encounter (Signed)
 Requested Prescriptions  Pending Prescriptions Disp Refills   metFORMIN  (GLUCOPHAGE -XR) 500 MG 24 hr tablet [Pharmacy Med Name: metFORMIN  HCl ER 500 MG Oral Tablet Extended Release 24 Hour] 90 tablet 0    Sig: Take 1 tablet by mouth once daily with breakfast     Endocrinology:  Diabetes - Biguanides Failed - 01/24/2024 10:19 AM      Failed - B12 Level in normal range and within 720 days    No results found for: VITAMINB12       Passed - Cr in normal range and within 360 days    Creatinine, Ser  Date Value Ref Range Status  09/03/2023 0.97 0.57 - 1.00 mg/dL Final         Passed - HBA1C is between 0 and 7.9 and within 180 days    Hemoglobin A1C  Date Value Ref Range Status  01/03/2024 5.8 (A) 4.0 - 5.6 % Final  03/21/2016 6.2  Final   Hgb A1c MFr Bld  Date Value Ref Range Status  09/03/2023 6.5 (H) 4.8 - 5.6 % Final    Comment:             Prediabetes: 5.7 - 6.4          Diabetes: >6.4          Glycemic control for adults with diabetes: <7.0          Passed - eGFR in normal range and within 360 days    GFR calc Af Amer  Date Value Ref Range Status  04/20/2020 79 >59 mL/min/1.73 Final    Comment:    **In accordance with recommendations from the NKF-ASN Task force,**   [facility] is in the process of updating its eGFR calculation to the   2021 CKD-EPI creatinine equation that estimates kidney function   without a race variable.    GFR calc non Af Amer  Date Value Ref Range Status  04/20/2020 68 >59 mL/min/1.73 Final   eGFR  Date Value Ref Range Status  09/03/2023 64 >59 mL/min/1.73 Final         Passed - Valid encounter within last 6 months    Recent Outpatient Visits           3 weeks ago Type II diabetes mellitus with complication [facility])   [place] [facility] at [facility], Leita DEL, MD   4 months ago Annual physical exam   [facility] at [facility], Leita DEL, MD   5 months ago  Type II diabetes mellitus with complication [facility]   [place] [facility] Medicine at [facility], Leita DEL, MD   6 months ago Acute non-recurrent maxillary sinusitis   [place] [facility] Medicine at [facility], Leita DEL, MD              Passed - CBC within normal limits and completed in the last 12 months    WBC  Date Value Ref Range Status  09/03/2023 8.6 3.4 - 10.8 x10E3/uL Final   RBC  Date Value Ref Range Status  09/03/2023 4.93 3.77 - 5.28 x10E6/uL Final  11/27/2016 2 (A) 4.04 - 5.48 M/uL Final   Hemoglobin  Date Value Ref Range Status  09/03/2023 14.1 11.1 - 15.9 g/dL Final   Hematocrit  Date Value Ref Range Status  09/03/2023 43.2 34.0 - 46.6 % Final   MCHC  Date Value Ref Range Status  09/03/2023 32.6 31.5 - 35.7 g/dL Final   [facility]  Date Value Ref Range Status  09/03/2023 28.6 26.6 - 33.0 pg Final   MCV  Date Value Ref Range Status  09/03/2023 88 79 - 97 fL Final   No results found for: PLTCOUNTKUC, LABPLAT, POCPLA RDW  Date Value Ref Range Status  09/03/2023 13.1 11.7 - 15.4 % Final

## 2024-02-06 ENCOUNTER — Telehealth: Payer: Self-pay

## 2024-02-06 NOTE — Telephone Encounter (Signed)
 Copied from CRM #[id number]. Topic: Clinical - Lab/Test Results >> Feb 06, 2024  1:51 PM Diamond Jackson ORN wrote: Reason for CRM: had a ct scan done two weeks ago and no one has called her back with the results. Please call back the patient with results.

## 2024-02-06 NOTE — Telephone Encounter (Signed)
 Please review and call patient about CT results.  Jm

## 2024-02-07 NOTE — Telephone Encounter (Signed)
 Sent patient my chart message informing.

## 2024-02-08 ENCOUNTER — Telehealth: Payer: Self-pay | Admitting: Acute Care

## 2024-02-08 NOTE — Telephone Encounter (Signed)
 Received call report on pt's LDCT. Impression below:   IMPRESSION: 1. Lung-RADS 3, probably benign findings. Short-term follow-up in 6 months is recommended with repeat low-dose chest CT without contrast (please use the following order, CT CHEST LCS NODULE FOLLOW-UP W/O CM). Several new indistinct right upper lobe pulmonary nodules, largest a part solid 14.4 mm right upper lobe nodule, favoring inflammatory. 2. Three-vessel coronary atherosclerosis. 3. Aortic Atherosclerosis (ICD10-I70.0) and Emphysema (ICD10-J43.9).   These results will be called to the ordering clinician or representative by the Radiologist Assistant, and communication documented in the PACS or [employer].     Electronically Signed   By: Selinda DELENA Blue M.D.   On: 02/08/2024 13:37

## 2024-02-08 NOTE — Telephone Encounter (Signed)
 6 month follow up on this is fine. Can we call her and see if she has been sick? Thanks

## 2024-02-11 DIAGNOSIS — E119 Type 2 diabetes mellitus without complications: Secondary | ICD-10-CM | POA: Diagnosis not present

## 2024-02-12 ENCOUNTER — Other Ambulatory Visit: Payer: Self-pay

## 2024-02-12 DIAGNOSIS — Z122 Encounter for screening for malignant neoplasm of respiratory organs: Secondary | ICD-10-CM

## 2024-02-12 DIAGNOSIS — Z87891 Personal history of nicotine dependence: Secondary | ICD-10-CM

## 2024-02-12 DIAGNOSIS — F17210 Nicotine dependence, cigarettes, uncomplicated: Secondary | ICD-10-CM

## 2024-02-12 NOTE — Telephone Encounter (Signed)
 Hi Diamond Jackson  I have called and spoke with the patient and reviewed results. She states she had sinus issues/cold at time of the scan and still has sinus issues today. She is in agreement to a 6 month follow up scan. Order placed and scheduled for 07/25/2024.   Isaiah

## 2024-03-12 DIAGNOSIS — E119 Type 2 diabetes mellitus without complications: Secondary | ICD-10-CM | POA: Diagnosis not present

## 2024-03-13 ENCOUNTER — Encounter: Payer: Self-pay | Admitting: Internal Medicine

## 2024-03-15 ENCOUNTER — Other Ambulatory Visit: Payer: Self-pay | Admitting: Internal Medicine

## 2024-03-15 DIAGNOSIS — I700 Atherosclerosis of aorta: Secondary | ICD-10-CM

## 2024-03-17 NOTE — Telephone Encounter (Signed)
 Requested Prescriptions  Pending Prescriptions Disp Refills   atorvastatin  (LIPITOR) 10 MG tablet [Pharmacy Med Name: Atorvastatin  Calcium  10 MG Oral Tablet] 90 tablet 1    Sig: Take 1 tablet by mouth once daily     Cardiovascular:  Antilipid - Statins Failed - 03/17/2024  1:49 PM      Failed - Lipid Panel in normal range within the last 12 months    Cholesterol, Total  Date Value Ref Range Status  09/03/2023 125 100 - 199 mg/dL Final   LDL Chol Calc (NIH)  Date Value Ref Range Status  09/03/2023 66 0 - 99 mg/dL Final   HDL  Date Value Ref Range Status  09/03/2023 41 >39 mg/dL Final   Triglycerides  Date Value Ref Range Status  09/03/2023 96 0 - 149 mg/dL Final         Passed - Patient is not pregnant      Passed - Valid encounter within last 12 months    Recent Outpatient Visits           2 months ago Type II diabetes mellitus with complication (HCC)   [place] [facility] at [facility], Leita DEL, MD   6 months ago Annual physical exam   [facility] at [facility], Leita DEL, MD   7 months ago Type II diabetes mellitus with complication [facility])   [place] [facility] Care & Sports Medicine at [facility], Leita DEL, MD   7 months ago Acute non-recurrent maxillary sinusitis   [facility] Medicine at [facility], Leita DEL, MD

## 2024-03-26 DIAGNOSIS — J449 Chronic obstructive pulmonary disease, unspecified: Secondary | ICD-10-CM | POA: Diagnosis not present

## 2024-04-12 DIAGNOSIS — E119 Type 2 diabetes mellitus without complications: Secondary | ICD-10-CM | POA: Diagnosis not present

## 2024-04-20 ENCOUNTER — Other Ambulatory Visit: Payer: Self-pay | Admitting: Internal Medicine

## 2024-04-20 DIAGNOSIS — E118 Type 2 diabetes mellitus with unspecified complications: Secondary | ICD-10-CM

## 2024-04-21 NOTE — Telephone Encounter (Signed)
 Requested Prescriptions  Pending Prescriptions Disp Refills   metFORMIN  (GLUCOPHAGE -XR) 500 MG 24 hr tablet [Pharmacy Med Name: metFORMIN  HCl ER 500 MG Oral Tablet Extended Release 24 Hour] 90 tablet 0    Sig: Take 1 tablet by mouth once daily with breakfast     Endocrinology:  Diabetes - Biguanides Failed - 04/21/2024  5:30 PM      Failed - B12 Level in normal range and within 720 days    No results found for: VITAMINB12       Passed - Cr in normal range and within 360 days    Creatinine, Ser  Date Value Ref Range Status  09/03/2023 0.97 0.57 - 1.00 mg/dL Final         Passed - HBA1C is between 0 and 7.9 and within 180 days    Hemoglobin A1C  Date Value Ref Range Status  01/03/2024 5.8 (A) 4.0 - 5.6 % Final  03/21/2016 6.2  Final   Hgb A1c MFr Bld  Date Value Ref Range Status  09/03/2023 6.5 (H) 4.8 - 5.6 % Final    Comment:             Prediabetes: 5.7 - 6.4          Diabetes: >6.4          Glycemic control for adults with diabetes: <7.0          Passed - eGFR in normal range and within 360 days    GFR calc Af Amer  Date Value Ref Range Status  04/20/2020 79 >59 mL/min/1.73 Final    Comment:    **In accordance with recommendations from the NKF-ASN Task force,**   [facility] is in the process of updating its eGFR calculation to the   2021 CKD-EPI creatinine equation that estimates kidney function   without a race variable.    GFR calc non Af Amer  Date Value Ref Range Status  04/20/2020 68 >59 mL/min/1.73 Final   eGFR  Date Value Ref Range Status  09/03/2023 64 >59 mL/min/1.73 Final         Passed - Valid encounter within last 6 months    Recent Outpatient Visits           3 months ago Type II diabetes mellitus with complication [facility])   [place] [facility] Medicine at [facility], Leita DEL, MD   7 months ago Annual physical exam   [facility] at [facility], Leita DEL, MD   8 months ago  Type II diabetes mellitus with complication [facility])   [place] [facility] Medicine at [facility], Leita DEL, MD   9 months ago Acute non-recurrent maxillary sinusitis   [place] [facility] Medicine at [facility], Leita DEL, MD              Passed - CBC within normal limits and completed in the last 12 months    WBC  Date Value Ref Range Status  09/03/2023 8.6 3.4 - 10.8 x10E3/uL Final   RBC  Date Value Ref Range Status  09/03/2023 4.93 3.77 - 5.28 x10E6/uL Final  11/27/2016 2 (A) 4.04 - 5.48 M/uL Final   Hemoglobin  Date Value Ref Range Status  09/03/2023 14.1 11.1 - 15.9 g/dL Final   Hematocrit  Date Value Ref Range Status  09/03/2023 43.2 34.0 - 46.6 % Final   MCHC  Date Value Ref Range  Status  09/03/2023 32.6 31.5 - 35.7 g/dL Final   [facility]  Date Value Ref Range Status  09/03/2023 28.6 26.6 - 33.0 pg Final   MCV  Date Value Ref Range Status  09/03/2023 88 79 - 97 fL Final   No results found for: PLTCOUNTKUC, LABPLAT, POCPLA RDW  Date Value Ref Range Status  09/03/2023 13.1 11.7 - 15.4 % Final

## 2024-04-22 DIAGNOSIS — J449 Chronic obstructive pulmonary disease, unspecified: Secondary | ICD-10-CM | POA: Diagnosis not present

## 2024-04-22 DIAGNOSIS — R911 Solitary pulmonary nodule: Secondary | ICD-10-CM | POA: Diagnosis not present

## 2024-04-22 DIAGNOSIS — F17210 Nicotine dependence, cigarettes, uncomplicated: Secondary | ICD-10-CM | POA: Diagnosis not present

## 2024-04-22 DIAGNOSIS — Z23 Encounter for immunization: Secondary | ICD-10-CM | POA: Diagnosis not present

## 2024-04-22 DIAGNOSIS — R0602 Shortness of breath: Secondary | ICD-10-CM | POA: Diagnosis not present

## 2024-05-15 ENCOUNTER — Ambulatory Visit: Admitting: Internal Medicine

## 2024-05-15 ENCOUNTER — Encounter: Payer: Self-pay | Admitting: Internal Medicine

## 2024-05-15 VITALS — BP 124/76 | HR 91 | Ht 65.0 in | Wt 194.0 lb

## 2024-05-15 DIAGNOSIS — E118 Type 2 diabetes mellitus with unspecified complications: Secondary | ICD-10-CM | POA: Diagnosis not present

## 2024-05-15 DIAGNOSIS — Z7984 Long term (current) use of oral hypoglycemic drugs: Secondary | ICD-10-CM

## 2024-05-15 LAB — POCT GLYCOSYLATED HEMOGLOBIN (HGB A1C): Hemoglobin A1C: 6.2 % — AB (ref 4.0–5.6)

## 2024-05-15 MED ORDER — METFORMIN HCL ER 500 MG PO TB24: 500.0000 mg | ORAL_TABLET | Freq: Every day | ORAL | 0 refills | Status: AC

## 2024-05-15 NOTE — Progress Notes (Signed)
 Date:  05/15/2024   Name:  Diamond Jackson   DOB:  Jun 15, 1955   MRN:  [id number]   Chief Complaint: Diabetes  Diabetes She presents for her follow-up diabetic visit. She has type 2 diabetes mellitus. Her disease course has been stable. Pertinent negatives for hypoglycemia include no headaches or tremors. Pertinent negatives for diabetes include no chest pain, no fatigue, no polydipsia and no polyuria.    Review of Systems  Constitutional:  Negative for appetite change, fatigue, fever and unexpected weight change.  HENT:  Negative for tinnitus and trouble swallowing.   Eyes:  Negative for visual disturbance.  Respiratory:  Negative for cough, chest tightness and shortness of breath.   Cardiovascular:  Negative for chest pain, palpitations and leg swelling.  Gastrointestinal:  Negative for abdominal pain.  Endocrine: Negative for polydipsia and polyuria.  Genitourinary:  Negative for dysuria and hematuria.  Musculoskeletal:  Negative for arthralgias.  Neurological:  Negative for tremors, numbness and headaches.  Psychiatric/Behavioral:  Negative for dysphoric mood.      Lab Results  Component Value Date   NA 140 09/03/2023   K 4.2 09/03/2023   CO2 20 09/03/2023   GLUCOSE 98 09/03/2023   BUN 21 09/03/2023   CREATININE 0.97 09/03/2023   CALCIUM  9.3 09/03/2023   EGFR 64 09/03/2023   GFRNONAA 68 04/20/2020   Lab Results  Component Value Date   CHOL 125 09/03/2023   HDL 41 09/03/2023   LDLCALC 66 09/03/2023   TRIG 96 09/03/2023   CHOLHDL 3.0 09/03/2023   Lab Results  Component Value Date   TSH 0.685 09/03/2023   Lab Results  Component Value Date   HGBA1C 6.2 (A) 05/15/2024   Lab Results  Component Value Date   WBC 8.6 09/03/2023   HGB 14.1 09/03/2023   HCT 43.2 09/03/2023   MCV 88 09/03/2023   PLT 198 09/03/2023   Lab Results  Component Value Date   ALT 14 09/03/2023   AST 12 09/03/2023   ALKPHOS 91 09/03/2023   BILITOT 0.3 09/03/2023   No results found  for: MARIEN BOLLS, VD25OH   Patient Active Problem List   Diagnosis Date Noted   Left wrist fracture, closed, initial encounter 09/03/2023   Hyperlipidemia associated with type 2 diabetes mellitus (HCC) 08/30/2022   Type II diabetes mellitus with complication (HCC) 10/20/2021   Coronary artery calcification seen on computed tomography 01/02/2021   Centrilobular emphysema (HCC) 06/01/2020   Aortic atherosclerosis 06/01/2020   History of colonic polyps    Polyp of transverse colon    Benign neoplasm of cecum    Benign neoplasm of transverse colon    Benign neoplasm of rectosigmoid junction    Environmental and seasonal allergies 03/22/2015   Fibrocystic breast changes 03/22/2015   Hx of peptic ulcer 03/22/2015   Current tobacco use 03/22/2015    No Known Allergies  Past Surgical History:  Procedure Laterality Date   ANKLE SURGERY Right 1984   BREAST BIOPSY Left    benign two areas/clips   CHOLECYSTECTOMY  2010   CLEFT PALATE REPAIR     as an infant   COLONOSCOPY  2010   normal   COLONOSCOPY WITH PROPOFOL  N/A 05/17/2018   Procedure: COLONOSCOPY WITH BIOPSIES;  Surgeon: Jinny Carmine, MD;  Location: [facility];  Service: Endoscopy;  Laterality: N/A;   COLONOSCOPY WITH PROPOFOL  N/A 09/29/2019   Procedure: COLONOSCOPY WITH BIOPSY;  Surgeon: Jinny Carmine, MD;  Location: [facility];  Service: Endoscopy;  Laterality: N/A;  priority 3   COLONOSCOPY WITH PROPOFOL  N/A 10/09/2022   Procedure: COLONOSCOPY WITH PROPOFOL ;  Surgeon: Jinny Carmine, MD;  Location: [facility];  Service: Endoscopy;  Laterality: N/A;   POLYPECTOMY  05/17/2018   Procedure: POLYPECTOMY;  Surgeon: Jinny Carmine, MD;  Location: [facility];  Service: Endoscopy;;   POLYPECTOMY N/A 09/29/2019   Procedure: POLYPECTOMY;  Surgeon: Jinny Carmine, MD;  Location: [facility];  Service: Endoscopy;  Laterality: N/A;  Clips x 2 placed at site of Hepatic Flexure Polyp site removal    POLYPECTOMY  10/09/2022   Procedure: POLYPECTOMY;  Surgeon: Jinny Carmine, MD;  Location: [facility];  Service: Endoscopy;;    Social History   Tobacco Use   Smoking status: Every Day    Current packs/day: 1.50    Average packs/day: 1.5 packs/day for 51.9 years (77.9 ttl pk-yrs)    Types: Cigarettes    Start date: 1974   Smokeless tobacco: Never   Tobacco comments:    11/07/23 smokes ~ 1/2 ppd  Vaping Use   Vaping status: Every Day   Substances: Nicotine, Flavoring  Substance Use Topics   Alcohol use: No    Alcohol/week: 0.0 standard drinks of alcohol   Drug use: No     Medication list has been reviewed and updated.  Current Meds  Medication Sig   albuterol  (VENTOLIN  HFA) 108 (90 Base) MCG/ACT inhaler Inhale 2 puffs into the lungs every 4 (four) hours as needed for wheezing or shortness of breath.   ANORO ELLIPTA 62.5-25 MCG/ACT AEPB 1 puff daily.   atorvastatin  (LIPITOR) 10 MG tablet Take 1 tablet by mouth once daily   blood glucose meter kit and supplies Dispense based on patient and insurance preference. Use up to four times daily as directed. (FOR ICD-10 E10.9, E11.9).   cetirizine (ZYRTEC) 10 MG tablet Take 10 mg by mouth daily as needed for allergies.   montelukast  (SINGULAIR ) 10 MG tablet Take 1 tablet (10 mg total) by mouth at bedtime.   OPCON-A 0.027-0.315 % SOLN For eyes   pantoprazole  (PROTONIX ) 40 MG tablet TAKE 1 TABLET BY MOUTH ONCE DAILY IN THE MORNING   [DISCONTINUED] metFORMIN  (GLUCOPHAGE -XR) 500 MG 24 hr tablet Take 1 tablet by mouth once daily with breakfast       05/15/2024    8:33 AM 01/03/2024    8:57 AM 09/03/2023    8:12 AM 07/30/2023    8:55 AM  GAD 7 : Generalized Anxiety Score  Nervous, Anxious, on Edge 0 0 0 0  Control/stop worrying 0 0 0 0  Worry too much - different things 0 0 0 0  Trouble relaxing 0 0 0 0  Restless 0 0 0 0  Easily annoyed or irritable 0 0 0 0  Afraid - awful might happen 0 0 0 0  Total GAD 7 Score 0 0 0 0   Anxiety Difficulty Not difficult at all Not difficult at all Not difficult at all Not difficult at all       05/15/2024    8:33 AM 01/03/2024    8:57 AM 12/31/2023    3:22 PM  Depression screen PHQ 2/9  Decreased Interest 0 0 0  Down, Depressed, Hopeless 0 0 0  PHQ - 2 Score 0 0 0  Altered sleeping 0 0   Tired, decreased energy 0 1   Change in appetite 0 0   Feeling bad or failure about yourself  0 0   Trouble concentrating 0  0   Moving slowly or fidgety/restless 0 0   Suicidal thoughts 0 0   PHQ-9 Score 0 1    Difficult doing work/chores Not difficult at all Not difficult at all      Data saved with a previous flowsheet row definition    BP Readings from Last 3 Encounters:  05/15/24 124/76  01/03/24 124/72  09/03/23 120/70    Physical Exam Vitals and nursing note reviewed.  Constitutional:      General: She is not in acute distress.    Appearance: She is well-developed.  HENT:     Head: Normocephalic and atraumatic.  Neck:     Vascular: No carotid bruit.  Cardiovascular:     Rate and Rhythm: Normal rate and regular rhythm.  Pulmonary:     Effort: Pulmonary effort is normal. No respiratory distress.     Breath sounds: No wheezing or rhonchi.  Musculoskeletal:     Cervical back: Normal range of motion.     Right lower leg: No edema.     Left lower leg: No edema.  Lymphadenopathy:     Cervical: No cervical adenopathy.  Skin:    General: Skin is warm and dry.     Findings: No rash.  Neurological:     Mental Status: She is alert and oriented to person, place, and time.  Psychiatric:        Mood and Affect: Mood normal.        Behavior: Behavior normal.     Wt Readings from Last 3 Encounters:  05/15/24 194 lb (88 kg)  01/23/24 187 lb (84.8 kg)  01/03/24 190 lb (86.2 kg)    BP 124/76   Pulse 91   Ht 5' 5 (1.651 m)   Wt 194 lb (88 kg)   SpO2 (!) 77%   BMI 32.28 kg/m   Assessment and Plan:  Problem List Items Addressed This Visit        Unprioritized   Type II diabetes mellitus with complication (HCC) - Primary (Chronic)   Currently medications are MTF.  No hypoglycemic episodes noted. Home blood sugars in the 110 range. Last visit medical regimen changes were none. Complications include hyperlipidemia with aortic and coronary atherosclerosis. Lab Results  Component Value Date   HGBA1C 5.8 (A) 01/03/2024  A1C today = 6.2 today.  Continue same medications and work on diet and weight control.      Relevant Medications   metFORMIN  (GLUCOPHAGE -XR) 500 MG 24 hr tablet   Other Relevant Orders   POCT glycosylated hemoglobin (Hb A1C) (Completed)   Other Visit Diagnoses       Long term current use of oral hypoglycemic drug          She is changing PCP to [facility] [place]. No follow-ups on file.    Leita HILARIO Adie, MD [facility] [place]

## 2024-05-15 NOTE — Assessment & Plan Note (Addendum)
 Currently medications are MTF.  No hypoglycemic episodes noted. Home blood sugars in the 110 range. Last visit medical regimen changes were none. Lab Results  Component Value Date   HGBA1C 5.8 (A) 01/03/2024  A1C today = 6.2 today.  Continue same medications and work on diet and weight control.

## 2024-06-26 ENCOUNTER — Other Ambulatory Visit: Payer: Self-pay | Admitting: Acute Care

## 2024-06-26 DIAGNOSIS — Z87891 Personal history of nicotine dependence: Secondary | ICD-10-CM

## 2024-06-26 DIAGNOSIS — F17210 Nicotine dependence, cigarettes, uncomplicated: Secondary | ICD-10-CM

## 2024-06-26 DIAGNOSIS — Z122 Encounter for screening for malignant neoplasm of respiratory organs: Secondary | ICD-10-CM

## 2024-07-02 ENCOUNTER — Other Ambulatory Visit: Payer: Self-pay

## 2024-07-02 DIAGNOSIS — J3089 Other allergic rhinitis: Secondary | ICD-10-CM

## 2024-07-02 MED ORDER — MONTELUKAST SODIUM 10 MG PO TABS: 10.0000 mg | ORAL_TABLET | Freq: Every day | ORAL | 1 refills | Status: AC

## 2024-07-25 ENCOUNTER — Ambulatory Visit

## 2024-11-12 ENCOUNTER — Ambulatory Visit
